# Patient Record
Sex: Female | Born: 1990 | Race: White | Hispanic: No | Marital: Married | Smoking: Never smoker
Health system: Southern US, Community
[De-identification: ages and names within clinical notes are randomized; demographics above are authoritative.]

## PROBLEM LIST (undated history)

## (undated) DIAGNOSIS — F319 Bipolar disorder, unspecified: Secondary | ICD-10-CM

## (undated) DIAGNOSIS — F3181 Bipolar II disorder: Secondary | ICD-10-CM

## (undated) DIAGNOSIS — N200 Calculus of kidney: Secondary | ICD-10-CM

## (undated) DIAGNOSIS — R51 Headache: Secondary | ICD-10-CM

## (undated) DIAGNOSIS — R519 Headache, unspecified: Secondary | ICD-10-CM

## (undated) DIAGNOSIS — F419 Anxiety disorder, unspecified: Secondary | ICD-10-CM

## (undated) HISTORY — DX: Headache: R51

## (undated) HISTORY — DX: Anxiety disorder, unspecified: F41.9

## (undated) HISTORY — PX: NO PAST SURGERIES: SHX2092

## (undated) HISTORY — DX: Calculus of kidney: N20.0

## (undated) HISTORY — DX: Headache, unspecified: R51.9

## (undated) HISTORY — DX: Bipolar disorder, unspecified: F31.9

---

## 2012-08-08 HISTORY — PX: WISDOM TOOTH EXTRACTION: SHX21

## 2017-09-05 ENCOUNTER — Emergency Department (HOSPITAL_COMMUNITY): Payer: Managed Care, Other (non HMO)

## 2017-09-05 ENCOUNTER — Encounter (HOSPITAL_COMMUNITY): Payer: Self-pay

## 2017-09-05 ENCOUNTER — Emergency Department (HOSPITAL_COMMUNITY)
Admission: EM | Admit: 2017-09-05 | Discharge: 2017-09-05 | Disposition: A | Discharge status: Home / Self Care | Payer: Managed Care, Other (non HMO) | Attending: Emergency Medicine | Admitting: Emergency Medicine

## 2017-09-05 ENCOUNTER — Other Ambulatory Visit: Payer: Self-pay

## 2017-09-05 DIAGNOSIS — R109 Unspecified abdominal pain: Secondary | ICD-10-CM

## 2017-09-05 DIAGNOSIS — Z3A01 Less than 8 weeks gestation of pregnancy: Secondary | ICD-10-CM | POA: Diagnosis not present

## 2017-09-05 DIAGNOSIS — R102 Pelvic and perineal pain: Secondary | ICD-10-CM | POA: Insufficient documentation

## 2017-09-05 DIAGNOSIS — Z79899 Other long term (current) drug therapy: Secondary | ICD-10-CM | POA: Diagnosis not present

## 2017-09-05 DIAGNOSIS — O9989 Other specified diseases and conditions complicating pregnancy, childbirth and the puerperium: Secondary | ICD-10-CM | POA: Diagnosis not present

## 2017-09-05 DIAGNOSIS — R319 Hematuria, unspecified: Secondary | ICD-10-CM | POA: Diagnosis not present

## 2017-09-05 LAB — COMPREHENSIVE METABOLIC PANEL
ALK PHOS: 38 U/L (ref 38–126)
ALT: 18 U/L (ref 14–54)
AST: 19 U/L (ref 15–41)
Albumin: 4.1 g/dL (ref 3.5–5.0)
Anion gap: 8 (ref 5–15)
BILIRUBIN TOTAL: 0.5 mg/dL (ref 0.3–1.2)
BUN: 10 mg/dL (ref 6–20)
CO2: 20 mmol/L — ABNORMAL LOW (ref 22–32)
CREATININE: 0.72 mg/dL (ref 0.44–1.00)
Calcium: 9.3 mg/dL (ref 8.9–10.3)
Chloride: 109 mmol/L (ref 101–111)
GFR calc Af Amer: >60 mL/min (ref >60)
Glucose, Bld: 115 mg/dL — ABNORMAL HIGH (ref 65–99)
Potassium: 3.7 mmol/L (ref 3.5–5.1)
Sodium: 137 mmol/L (ref 135–145)
TOTAL PROTEIN: 7.6 g/dL (ref 6.5–8.1)

## 2017-09-05 LAB — CBC WITH DIFFERENTIAL/PLATELET
BASOS ABS: 0 10*3/uL (ref 0.0–0.1)
Basophils Relative: 0 %
EOS PCT: 0 %
Eosinophils Absolute: 0 10*3/uL (ref 0.0–0.7)
HCT: 37.6 % (ref 36.0–46.0)
Hemoglobin: 13.1 g/dL (ref 12.0–15.0)
Lymphocytes Relative: 7 %
Lymphs Abs: 0.7 10*3/uL (ref 0.7–4.0)
MCH: 30.7 pg (ref 26.0–34.0)
MCHC: 34.8 g/dL (ref 30.0–36.0)
MCV: 88.1 fL (ref 78.0–100.0)
MONO ABS: 0.4 10*3/uL (ref 0.1–1.0)
Monocytes Relative: 4 %
Neutro Abs: 8.6 10*3/uL — ABNORMAL HIGH (ref 1.7–7.7)
Neutrophils Relative %: 89 %
PLATELETS: 256 10*3/uL (ref 150–400)
RBC: 4.27 MIL/uL (ref 3.87–5.11)
RDW: 12.1 % (ref 11.5–15.5)
WBC: 9.7 10*3/uL (ref 4.0–10.5)

## 2017-09-05 LAB — URINALYSIS, ROUTINE W REFLEX MICROSCOPIC
Bilirubin Urine: NEGATIVE
GLUCOSE, UA: NEGATIVE mg/dL
Ketones, ur: 80 mg/dL — AB
Leukocytes, UA: NEGATIVE
Nitrite: NEGATIVE
PH: 6 (ref 5.0–8.0)
Protein, ur: NEGATIVE mg/dL
Specific Gravity, Urine: 1.011 (ref 1.005–1.030)

## 2017-09-05 LAB — I-STAT BETA HCG BLOOD, ED (MC, WL, AP ONLY): I-stat hCG, quantitative: >2000 m[IU]/mL — ABNORMAL HIGH (ref <5)

## 2017-09-05 LAB — ABO/RH: ABO/RH(D): O NEG

## 2017-09-05 LAB — LIPASE, BLOOD: LIPASE: 31 U/L (ref 11–51)

## 2017-09-05 LAB — I-STAT CG4 LACTIC ACID, ED: LACTIC ACID, VENOUS: 1.36 mmol/L (ref 0.5–1.9)

## 2017-09-05 MED ORDER — MORPHINE SULFATE (PF) 4 MG/ML IV SOLN
4.0000 mg | Freq: Once | INTRAVENOUS | Status: AC
  Administered 2017-09-05: 4 mg via INTRAVENOUS
  Filled 2017-09-05: qty 1

## 2017-09-05 MED ORDER — ONDANSETRON 4 MG PO TBDP: 4.0000 mg | ORAL_TABLET | ORAL | 0 refills | Status: DC | PRN

## 2017-09-05 MED ORDER — ONDANSETRON HCL 4 MG/2ML IJ SOLN
4.0000 mg | Freq: Once | INTRAMUSCULAR | Status: AC
  Administered 2017-09-05: 4 mg via INTRAVENOUS
  Filled 2017-09-05: qty 2

## 2017-09-05 MED ORDER — HYDROCODONE-ACETAMINOPHEN 5-325 MG PO TABS
2.0000 | ORAL_TABLET | Freq: Once | ORAL | Status: AC
  Administered 2017-09-05: 2 via ORAL
  Filled 2017-09-05: qty 2

## 2017-09-05 MED ORDER — HYDROCODONE-ACETAMINOPHEN 5-325 MG PO TABS: 1.0000 | ORAL_TABLET | Freq: Four times a day (QID) | ORAL | 0 refills | Status: DC | PRN

## 2017-09-05 MED ORDER — SODIUM CHLORIDE 0.9 % IV BOLUS (SEPSIS)
1000.0000 mL | Freq: Once | INTRAVENOUS | Status: AC
  Administered 2017-09-05: 1000 mL via INTRAVENOUS

## 2017-09-05 MED ORDER — SODIUM CHLORIDE 0.9 % IV SOLN
1000.0000 mL | INTRAVENOUS | Status: DC
  Administered 2017-09-05: 1000 mL via INTRAVENOUS

## 2017-09-05 MED ORDER — CEPHALEXIN 500 MG PO CAPS: 1000.0000 mg | ORAL_CAPSULE | Freq: Two times a day (BID) | ORAL | 0 refills | Status: DC

## 2017-09-05 NOTE — ED Notes (Signed)
Strainer provided for patient.

## 2017-09-05 NOTE — ED Notes (Signed)
ED Provider at bedside. 

## 2017-09-05 NOTE — ED Triage Notes (Signed)
Patient c/o left flank pain, vomiting and urinary frequency since yesterday. patient is [redacted] weeks pregnant

## 2017-09-05 NOTE — ED Provider Notes (Signed)
[facility]   CSN: [id number] Arrival date & time: 09/05/17  4098     History   Chief Complaint Chief Complaint  Patient presents with  . Back Pain  . Urinary Frequency  . Emesis  . [redacted] weeks pregnant    HPI Kathleen Bass is a 27 y.o. female.  HPI Patient reports she is [redacted] weeks pregnant by dates.  She reports that yesterday late afternoon she had sudden and severe onset of left flank and pelvic pain.  She reports is been constant and she has vomited multiple times.  No fever, no chills.  She denies any pain burning or urgency of urination.  Patient was not having any vaginal discharge bleeding or spotting.  She reports she is never had similar pain.  She has not had ultrasound yet in this pregnancy.  No other medical problems except controlled bipolar disorder.  Otherwise healthy. History reviewed. No pertinent past medical history.  There are no active problems to display for this patient.   History reviewed. No pertinent surgical history.  OB History    Gravida Para Term Preterm AB Living   1             SAB TAB Ectopic Multiple Live Births                   Home Medications    Prior to Admission medications   Medication Sig Start Date End Date Taking? Authorizing Provider  LATUDA 40 MG TABS tablet Take 40 mg by mouth at bedtime. 06/07/17  Yes [provider]  cephALEXin (KEFLEX) 500 MG capsule Take 2 capsules (1,000 mg total) by mouth 2 (two) times daily. 09/05/17   Arby Barrette, MD  HYDROcodone-acetaminophen (NORCO/VICODIN) 5-325 MG tablet Take 1-2 tablets by mouth every 6 (six) hours as needed. 09/05/17   Arby Barrette, MD  ondansetron (ZOFRAN ODT) 4 MG disintegrating tablet Take 1 tablet (4 mg total) by mouth every 4 (four) hours as needed for nausea or vomiting. 09/05/17   Arby Barrette, MD    Family History Family History  Problem Relation Age of Onset  . Seizures Mother     Social  History Social History   Tobacco Use  . Smoking status: Never Smoker  . Smokeless tobacco: Never Used  Substance Use Topics  . Alcohol use: No    Frequency: Never  . Drug use: No     Allergies   Patient has no known allergies.   Review of Systems Review of Systems 10 Systems reviewed and are negative for acute change except as noted in the HPI.  Physical Exam Updated Vital Signs BP 115/82 (BP Location: Left Arm)   Pulse 97   Temp 98.2 F (36.8 C)   Resp 16   Ht 5\' 3"  (1.6 m)   Wt 63.5 kg (140 lb)   LMP 07/14/2017   SpO2 100%   BMI 24.80 kg/m   Physical Exam  Constitutional: She is oriented to person, place, and time. She appears well-developed and well-nourished. She appears distressed.  Patient is alert and appropriate.  Nontoxic.  She is however actively vomiting and appears to be in pain.  HENT:  Head: Normocephalic and atraumatic.  Mouth/Throat: Oropharynx is clear and moist.  Eyes: Conjunctivae and EOM are normal. Pupils are equal, round, and reactive to light.  Neck: Neck supple.  Cardiovascular: Normal rate, regular rhythm, normal heart sounds and intact distal pulses.  No murmur heard. Pulmonary/Chest: Effort normal and  breath sounds normal. No respiratory distress.  Abdominal: Soft. There is tenderness.  Moderate to severe pain left lower abdomen and mid abdomen.  Musculoskeletal: Normal range of motion. She exhibits no edema or tenderness.  Neurological: She is alert and oriented to person, place, and time. No cranial nerve deficit. She exhibits normal muscle tone. Coordination normal.  Skin: Skin is warm and dry.  Psychiatric: She has a normal mood and affect.  Nursing note and vitals reviewed.    ED Treatments / Results  Labs (all labs ordered are listed, but only abnormal results are displayed) Labs Reviewed  URINALYSIS, ROUTINE W REFLEX MICROSCOPIC - Abnormal; Notable for the following components:      Result Value   Hgb urine dipstick LARGE  (*)    Ketones, ur 80 (*)    Bacteria, UA RARE (*)    Squamous Epithelial / LPF 0-5 (*)    All other components within normal limits  COMPREHENSIVE METABOLIC PANEL - Abnormal; Notable for the following components:   CO2 20 (*)    Glucose, Bld 115 (*)    All other components within normal limits  CBC WITH DIFFERENTIAL/PLATELET - Abnormal; Notable for the following components:   Neutro Abs 8.6 (*)    All other components within normal limits  I-STAT BETA HCG BLOOD, ED ([facility], [facility], [facility] ONLY) - Abnormal; Notable for the following components:   I-stat hCG, quantitative >2,000.0 (*)    All other components within normal limits  URINE CULTURE  LIPASE, BLOOD  POC URINE PREG, ED  I-STAT CG4 LACTIC ACID, ED  ABO/RH    EKG  EKG Interpretation None       Radiology [place] Ob Comp Less 14 Wks  Result Date: 09/05/2017 CLINICAL DATA:  Left-sided pelvic pain for 2 days. EXAM: OBSTETRIC <14 WK US AND TRANSVAGINAL OB US TECHNIQUE: Both transabdominal and transvaginal ultrasound examinations were performed for complete evaluation of the gestation as well as the maternal uterus, adnexal regions, and pelvic cul-de-sac. Transvaginal technique was performed to assess early pregnancy. COMPARISON:  None. FINDINGS: Intrauterine gestational sac: Single Yolk sac:  Visualized. Embryo:  Visualized. Cardiac Activity: Visualized. Heart Rate: 130 bpm CRL:  11 mm   7 w   2 d                  US EDC: 04/22/2018 Subchorionic hemorrhage:  None visualized. Maternal uterus/adnexae: Normal appearance of both ovaries. No mass or abnormal free fluid identified. IMPRESSION: Single living IUP measuring 7 weeks 2 days, with US EDC of 04/22/2018. No significant maternal uterine or adnexal abnormality identified. Electronically Signed   By: Myles RosenthalJohn  Stahl M.D.   On: 09/05/2017 11:28   [place] Ob Transvaginal  Result Date: 09/05/2017 CLINICAL DATA:  Left-sided pelvic pain for 2 days. EXAM: OBSTETRIC <14 WK US AND TRANSVAGINAL OB US TECHNIQUE:  Both transabdominal and transvaginal ultrasound examinations were performed for complete evaluation of the gestation as well as the maternal uterus, adnexal regions, and pelvic cul-de-sac. Transvaginal technique was performed to assess early pregnancy. COMPARISON:  None. FINDINGS: Intrauterine gestational sac: Single Yolk sac:  Visualized. Embryo:  Visualized. Cardiac Activity: Visualized. Heart Rate: 130 bpm CRL:  11 mm   7 w   2 d                  US EDC: 04/22/2018 Subchorionic hemorrhage:  None visualized. Maternal uterus/adnexae: Normal appearance of both ovaries. No mass or abnormal free fluid identified. IMPRESSION: Single living IUP measuring 7 weeks  2 days, with [place] EDC of 04/22/2018. No significant maternal uterine or adnexal abnormality identified. Electronically Signed   By: Myles Rosenthal M.D.   On: 09/05/2017 11:28   US Renal  Result Date: 09/05/2017 CLINICAL DATA:  Initial evaluation for acute left flank pain for 2 days. EXAM: RENAL / URINARY TRACT ULTRASOUND COMPLETE COMPARISON:  None. FINDINGS: Right Kidney: Length: 10.3 cm. Echogenicity within normal limits. No mass or hydronephrosis visualized. Left Kidney: Length: 10.9 cm. Echogenicity within normal limits. No mass or hydronephrosis visualized. Bladder: Appears normal for degree of bladder distention. IMPRESSION: Normal renal ultrasound. Electronically Signed   By: Rise Mu M.D.   On: 09/05/2017 13:51    Procedures Procedures (including critical care time)  Medications Ordered in ED Medications  sodium chloride 0.9 % bolus 1,000 mL (0 mLs Intravenous Stopped 09/05/17 1039)    Followed by  0.9 %  sodium chloride infusion (0 mLs Intravenous Stopped 09/05/17 1246)  ondansetron (ZOFRAN) injection 4 mg (4 mg Intravenous Given 09/05/17 0957)  morphine 4 MG/ML injection 4 mg (4 mg Intravenous Given 09/05/17 0957)  morphine 4 MG/ML injection 4 mg (4 mg Intravenous Given 09/05/17 1246)  HYDROcodone-acetaminophen (NORCO/VICODIN) 5-325  MG per tablet 2 tablet (2 tablets Oral Given 09/05/17 1544)     Initial Impression / Assessment and Plan / ED Course  I have reviewed the triage vital signs and the nursing notes.  Pertinent labs & imaging results that were available during my care of the patient were reviewed by me and considered in my medical decision making (see chart for details).  Clinical Course as of Sep 05 1716  Tue Sep 05, 2017  1431 Consult: Dr. Adrian Black[facility].  At this time with patient pain controlled and other diagnostic evaluation negative recommends consultation with urology for management of highly suspected kidney stone.  [MP]  1556 Consult: Reviewed with Dr. Alvester Morin of urology.  Advises at this time with no hydro-patient can strain urine and take pain medications.  She is to follow-up in the office as an outpatient.  [MP]    Clinical Course User Index [MP] Arby Barrette, MD     Final Clinical Impressions(s) / ED Diagnoses   Final diagnoses:  Flank pain, acute  Less than [redacted] weeks gestation of pregnancy  Hematuria, unspecified type   Patient presents with acute and severe left lower quadrant and flank pain.  This was onset yesterday.  Clinically she is well in appearance.  Ultrasound is ruled out ectopic pregnancy.  Sister has history of kidney stone and presentation is very suggestive kidney stone in terms of pain quality and hematuria.  Ultrasound of the kidneys does not show hydronephrosis.  Have reviewed this with OB and urology on call.  At this time will treat the patient for pain and have her strain urine.  I have opted to empirically treat urine with hematuria for possibility of infection in early pregnancy.  She is clinically well in appearance and understands return precautions. ED Discharge Orders        Ordered    cephALEXin (KEFLEX) 500 MG capsule  2 times daily     09/05/17 1553    HYDROcodone-acetaminophen (NORCO/VICODIN) 5-325 MG tablet  Every 6 hours PRN     09/05/17 1553    ondansetron  (ZOFRAN ODT) 4 MG disintegrating tablet  Every 4 hours PRN     09/05/17 1553       Arby Barrette, MD 09/05/17 1719

## 2017-09-05 NOTE — Discharge Instructions (Signed)
1.  You may take Vicodin 1-2 tablets every 4-6 hours as needed for pain. 2.  Make an appointment as soon as possible to see the urologist and your OB doctor 3.  Return to the emergency department if you develop any fever, worsening pain or other concerns. 4.  Antibiotics were prescribed for urinary tract infection as sometimes urine shows positive for blood before signs of infection.  It is important that infections in pregnancy be treated early.

## 2017-09-05 NOTE — ED Notes (Signed)
Ultrasound at bedside

## 2017-09-07 LAB — URINE CULTURE: Culture: NO GROWTH

## 2017-09-22 LAB — OB RESULTS CONSOLE HIV ANTIBODY (ROUTINE TESTING): HIV: NONREACTIVE

## 2017-09-22 LAB — OB RESULTS CONSOLE ABO/RH: RH TYPE: NEGATIVE

## 2017-09-22 LAB — OB RESULTS CONSOLE RUBELLA ANTIBODY, IGM: RUBELLA: IMMUNE

## 2017-09-22 LAB — OB RESULTS CONSOLE RPR: RPR: NONREACTIVE

## 2017-09-22 LAB — OB RESULTS CONSOLE GC/CHLAMYDIA
Chlamydia: NEGATIVE
GC PROBE AMP, GENITAL: NEGATIVE

## 2017-09-22 LAB — OB RESULTS CONSOLE HEPATITIS B SURFACE ANTIGEN: Hepatitis B Surface Ag: NEGATIVE

## 2017-09-22 LAB — OB RESULTS CONSOLE ANTIBODY SCREEN: ANTIBODY SCREEN: NEGATIVE

## 2018-04-06 ENCOUNTER — Encounter (HOSPITAL_COMMUNITY): Payer: Self-pay | Admitting: *Deleted

## 2018-04-06 ENCOUNTER — Telehealth (HOSPITAL_COMMUNITY): Payer: Self-pay | Admitting: *Deleted

## 2018-04-06 NOTE — Telephone Encounter (Signed)
Preadmission screen  

## 2018-04-19 ENCOUNTER — Other Ambulatory Visit: Payer: Self-pay

## 2018-04-19 ENCOUNTER — Inpatient Hospital Stay (HOSPITAL_COMMUNITY): Payer: Managed Care, Other (non HMO) | Admitting: Anesthesiology

## 2018-04-19 ENCOUNTER — Encounter (HOSPITAL_COMMUNITY): Payer: Self-pay | Admitting: *Deleted

## 2018-04-19 ENCOUNTER — Inpatient Hospital Stay (HOSPITAL_COMMUNITY)
Admission: AD | Admit: 2018-04-19 | Discharge: 2018-04-21 | DRG: 806 | Disposition: A | Discharge status: Home / Self Care | Payer: Managed Care, Other (non HMO) | Attending: Obstetrics and Gynecology | Admitting: Obstetrics and Gynecology

## 2018-04-19 DIAGNOSIS — O99344 Other mental disorders complicating childbirth: Secondary | ICD-10-CM | POA: Diagnosis present

## 2018-04-19 DIAGNOSIS — Z23 Encounter for immunization: Secondary | ICD-10-CM

## 2018-04-19 DIAGNOSIS — Z3A40 40 weeks gestation of pregnancy: Secondary | ICD-10-CM | POA: Diagnosis not present

## 2018-04-19 DIAGNOSIS — Z3483 Encounter for supervision of other normal pregnancy, third trimester: Secondary | ICD-10-CM | POA: Diagnosis present

## 2018-04-19 DIAGNOSIS — F3181 Bipolar II disorder: Secondary | ICD-10-CM | POA: Diagnosis present

## 2018-04-19 HISTORY — DX: Bipolar II disorder: F31.81

## 2018-04-19 LAB — TYPE AND SCREEN
ABO/RH(D): O NEG
Antibody Screen: NEGATIVE

## 2018-04-19 LAB — CBC
HCT: 36.3 % (ref 36.0–46.0)
HCT: 33.5 % — ABNORMAL LOW (ref 36.0–46.0)
HEMOGLOBIN: 12.1 g/dL (ref 12.0–15.0)
HEMOGLOBIN: 11.2 g/dL — AB (ref 12.0–15.0)
MCH: 30.7 pg (ref 26.0–34.0)
MCH: 30.9 pg (ref 26.0–34.0)
MCHC: 33.3 g/dL (ref 30.0–36.0)
MCHC: 33.4 g/dL (ref 30.0–36.0)
MCV: 92.1 fL (ref 78.0–100.0)
MCV: 92.3 fL (ref 78.0–100.0)
PLATELETS: 142 10*3/uL — AB (ref 150–400)
Platelets: 141 10*3/uL — ABNORMAL LOW (ref 150–400)
RBC: 3.94 MIL/uL (ref 3.87–5.11)
RBC: 3.63 MIL/uL — AB (ref 3.87–5.11)
RDW: 14.8 % (ref 11.5–15.5)
RDW: 14.7 % (ref 11.5–15.5)
WBC: 11.3 10*3/uL — ABNORMAL HIGH (ref 4.0–10.5)
WBC: 16.7 10*3/uL — ABNORMAL HIGH (ref 4.0–10.5)

## 2018-04-19 LAB — ABO/RH: ABO/RH(D): O NEG

## 2018-04-19 MED ORDER — LIDOCAINE HCL (PF) 1 % IJ SOLN
INTRAMUSCULAR | Status: AC
  Filled 2018-04-19: qty 30

## 2018-04-19 MED ORDER — ACETAMINOPHEN 325 MG PO TABS
650.0000 mg | ORAL_TABLET | ORAL | Status: DC | PRN
  Administered 2018-04-20: 650 mg via ORAL
  Filled 2018-04-19: qty 2

## 2018-04-19 MED ORDER — WITCH HAZEL-GLYCERIN EX PADS: 1.0000 "application " | MEDICATED_PAD | CUTANEOUS | Status: DC | PRN

## 2018-04-19 MED ORDER — DIPHENHYDRAMINE HCL 25 MG PO CAPS: 25.0000 mg | ORAL_CAPSULE | Freq: Four times a day (QID) | ORAL | Status: DC | PRN

## 2018-04-19 MED ORDER — COCONUT OIL OIL
1.0000 "application " | TOPICAL_OIL | Status: DC | PRN
  Filled 2018-04-19: qty 120

## 2018-04-19 MED ORDER — MEASLES, MUMPS & RUBELLA VAC ~~LOC~~ INJ
0.5000 mL | INJECTION | Freq: Once | SUBCUTANEOUS | Status: DC
  Filled 2018-04-19: qty 0.5

## 2018-04-19 MED ORDER — PHENYLEPHRINE 40 MCG/ML (10ML) SYRINGE FOR IV PUSH (FOR BLOOD PRESSURE SUPPORT): 80.0000 ug | PREFILLED_SYRINGE | INTRAVENOUS | Status: DC | PRN

## 2018-04-19 MED ORDER — OXYCODONE-ACETAMINOPHEN 5-325 MG PO TABS: 2.0000 | ORAL_TABLET | ORAL | Status: DC | PRN

## 2018-04-19 MED ORDER — SENNOSIDES-DOCUSATE SODIUM 8.6-50 MG PO TABS
2.0000 | ORAL_TABLET | ORAL | Status: DC
  Administered 2018-04-20 – 2018-04-21 (×2): 2 via ORAL
  Filled 2018-04-19 (×2): qty 2

## 2018-04-19 MED ORDER — OXYTOCIN BOLUS FROM INFUSION: 500.0000 mL | Freq: Once | INTRAVENOUS | Status: DC

## 2018-04-19 MED ORDER — ONDANSETRON HCL 4 MG PO TABS: 4.0000 mg | ORAL_TABLET | ORAL | Status: DC | PRN

## 2018-04-19 MED ORDER — IBUPROFEN 600 MG PO TABS
600.0000 mg | ORAL_TABLET | Freq: Four times a day (QID) | ORAL | Status: DC
  Administered 2018-04-19 – 2018-04-21 (×7): 600 mg via ORAL
  Filled 2018-04-19 (×7): qty 1

## 2018-04-19 MED ORDER — DIBUCAINE 1 % RE OINT
1.0000 "application " | TOPICAL_OINTMENT | RECTAL | Status: DC | PRN
  Filled 2018-04-19: qty 28

## 2018-04-19 MED ORDER — OXYTOCIN 40 UNITS IN LACTATED RINGERS INFUSION - SIMPLE MED
INTRAVENOUS | Status: AC
  Administered 2018-04-19: 999 mL/h
  Filled 2018-04-19: qty 1000

## 2018-04-19 MED ORDER — LACTATED RINGERS IV SOLN
500.0000 mL | Freq: Once | INTRAVENOUS | Status: AC
  Administered 2018-04-19: 500 mL via INTRAVENOUS

## 2018-04-19 MED ORDER — ONDANSETRON HCL 4 MG/2ML IJ SOLN: 4.0000 mg | INTRAMUSCULAR | Status: DC | PRN

## 2018-04-19 MED ORDER — EPHEDRINE 5 MG/ML INJ: 10.0000 mg | INTRAVENOUS | Status: DC | PRN

## 2018-04-19 MED ORDER — DIPHENHYDRAMINE HCL 50 MG/ML IJ SOLN: 12.5000 mg | INTRAMUSCULAR | Status: DC | PRN

## 2018-04-19 MED ORDER — PRENATAL MULTIVITAMIN CH
1.0000 | ORAL_TABLET | Freq: Every day | ORAL | Status: DC
  Filled 2018-04-19: qty 1

## 2018-04-19 MED ORDER — SOD CITRATE-CITRIC ACID 500-334 MG/5ML PO SOLN: 30.0000 mL | ORAL | Status: DC | PRN

## 2018-04-19 MED ORDER — TETANUS-DIPHTH-ACELL PERTUSSIS 5-2.5-18.5 LF-MCG/0.5 IM SUSP
0.5000 mL | Freq: Once | INTRAMUSCULAR | Status: DC
  Filled 2018-04-19: qty 0.5

## 2018-04-19 MED ORDER — LIDOCAINE HCL (PF) 1 % IJ SOLN
INTRAMUSCULAR | Status: DC | PRN
  Administered 2018-04-19 (×2): 6 mL via EPIDURAL

## 2018-04-19 MED ORDER — PHENYLEPHRINE 40 MCG/ML (10ML) SYRINGE FOR IV PUSH (FOR BLOOD PRESSURE SUPPORT)
80.0000 ug | PREFILLED_SYRINGE | INTRAVENOUS | Status: DC | PRN
  Filled 2018-04-19: qty 10

## 2018-04-19 MED ORDER — OXYCODONE-ACETAMINOPHEN 5-325 MG PO TABS: 1.0000 | ORAL_TABLET | ORAL | Status: DC | PRN

## 2018-04-19 MED ORDER — MEDROXYPROGESTERONE ACETATE 150 MG/ML IM SUSP: 150.0000 mg | INTRAMUSCULAR | Status: DC | PRN

## 2018-04-19 MED ORDER — LIDOCAINE HCL (PF) 1 % IJ SOLN
30.0000 mL | INTRAMUSCULAR | Status: DC | PRN
  Filled 2018-04-19: qty 30

## 2018-04-19 MED ORDER — FENTANYL 2.5 MCG/ML BUPIVACAINE 1/10 % EPIDURAL INFUSION (WH - ANES)
14.0000 mL/h | INTRAMUSCULAR | Status: DC | PRN
  Administered 2018-04-19: 14 mL/h via EPIDURAL
  Filled 2018-04-19: qty 100

## 2018-04-19 MED ORDER — BUTORPHANOL TARTRATE 1 MG/ML IJ SOLN
1.0000 mg | Freq: Once | INTRAMUSCULAR | Status: AC
  Administered 2018-04-19: 1 mg via INTRAVENOUS
  Filled 2018-04-19: qty 1

## 2018-04-19 MED ORDER — OXYTOCIN 40 UNITS IN LACTATED RINGERS INFUSION - SIMPLE MED: 2.5000 [IU]/h | INTRAVENOUS | Status: DC

## 2018-04-19 MED ORDER — BENZOCAINE-MENTHOL 20-0.5 % EX AERO
1.0000 "application " | INHALATION_SPRAY | CUTANEOUS | Status: DC | PRN
  Filled 2018-04-19 (×2): qty 56

## 2018-04-19 MED ORDER — LACTATED RINGERS IV SOLN: 500.0000 mL | INTRAVENOUS | Status: DC | PRN

## 2018-04-19 MED ORDER — ONDANSETRON HCL 4 MG/2ML IJ SOLN: 4.0000 mg | Freq: Four times a day (QID) | INTRAMUSCULAR | Status: DC | PRN

## 2018-04-19 MED ORDER — LACTATED RINGERS IV SOLN
INTRAVENOUS | Status: DC
  Administered 2018-04-19 (×2): via INTRAVENOUS

## 2018-04-19 MED ORDER — SIMETHICONE 80 MG PO CHEW: 80.0000 mg | CHEWABLE_TABLET | ORAL | Status: DC | PRN

## 2018-04-19 MED ORDER — ACETAMINOPHEN 325 MG PO TABS: 650.0000 mg | ORAL_TABLET | ORAL | Status: DC | PRN

## 2018-04-19 NOTE — Progress Notes (Signed)
NICU at bedside for vacuum assisted delivery

## 2018-04-19 NOTE — Anesthesia Procedure Notes (Signed)
Epidural Patient location during procedure: OB Start time: 04/19/2018 1:00 PM End time: 04/19/2018 1:04 PM  Staffing Anesthesiologist: Leilani AbleHatchett, Abiageal Blowe, MD Performed: anesthesiologist   Preanesthetic Checklist Completed: patient identified, site marked, surgical consent, pre-op evaluation, timeout performed, IV checked, risks and benefits discussed and monitors and equipment checked  Epidural Patient position: sitting Prep: site prepped and draped and DuraPrep Patient monitoring: continuous pulse ox and blood pressure Approach: midline Location: L3-L4 Injection technique: LOR air  Needle:  Needle type: Tuohy  Needle gauge: 17 G Needle length: 9 cm and 9 Needle insertion depth: 5 cm cm Catheter type: closed end flexible Catheter size: 19 Gauge Catheter at skin depth: 10 cm Test dose: negative and Other  Assessment Sensory level: T9 Events: blood not aspirated, injection not painful, no injection resistance, negative IV test and no paresthesia  Additional Notes Reason for block:procedure for pain

## 2018-04-19 NOTE — MAU Note (Signed)
Started contracting at 0300. Contractions getting closer and stronger.  Was 1 cm last wk.  Scheduled for midnight induction tonight

## 2018-04-19 NOTE — Progress Notes (Addendum)
Called to room after 2h of pushing Patient is exhausted.  Discussed vacuum assist and pt agrees R/b/a reviewed, informed consent, foley removed. Bell vacuum applied and gentle traction applied with next 2 pushes Infant brought to crowning and vacuum released Midline episiotomy cut and infant delivered with next 2 pushes vigorous female infant placed on moms chest w/ apgars of 9,9.  Placenta delivered spontaneous w/ 3VC.   2nd degree midline episiotomy repaired w/ 3-0 vicryl rapide.  Fundus firm.  EBL 150cc .

## 2018-04-19 NOTE — H&P (Signed)
Wynona CanesMichelle Pio is a 27 y.o. female G1 @ 40 wks presenting for SOL/SROM - light meconium.  OB History    Gravida  1   Para      Term      Preterm      AB      Living        SAB      TAB      Ectopic      Multiple      Live Births             Past Medical History:  Diagnosis Date  . Anxiety   . Bipolar 2 disorder (HCC)    on meds, good control- doing well  . Bipolar disorder (HCC)   . Headache   . Kidney stone    Past Surgical History:  Procedure Laterality Date  . NO PAST SURGERIES     Family History: family history includes Seizures in her mother. Social History:  reports that she has never smoked. She has never used smokeless tobacco. She reports that she does not drink alcohol or use drugs.     Maternal Diabetes: No Genetic Screening: Declined Maternal Ultrasounds/Referrals: Normal Fetal Ultrasounds or other Referrals:  None Maternal Substance Abuse:  No Significant Maternal Medications:  Meds include: Other: latud Significant Maternal Lab Results:  None Other Comments:  None  ROS History Dilation: 1 Effacement (%): 80 Station: -1 Exam by:: jolynn Blood pressure 129/83, pulse (!) 106, temperature 98.5 F (36.9 C), temperature source Oral, resp. rate 18, last menstrual period 07/14/2017. Exam Physical Exam  Prenatal labs: ABO, Rh: O/Negative/-- (02/15 0000) Antibody: Negative (02/15 0000) Rubella: Immune (02/15 0000) RPR: Nonreactive (02/15 0000)  HBsAg: Negative (02/15 0000)  HIV: Non-reactive (02/15 0000)  GBS:   negative  Assessment/Plan: Admit Exp mngt Epidural prn   Zelphia CairoGretchen Kaylen Nghiem 04/19/2018, 9:55 AM

## 2018-04-19 NOTE — Progress Notes (Signed)
Pt comfortable with epidural  FHT cat 1 Toco Q2 Cvx 9/C/0 AROM forebag  A/P:  Continue exp mngt

## 2018-04-19 NOTE — Anesthesia Pain Management Evaluation Note (Signed)
  CRNA Pain Management Visit Note  Patient: Kathleen Bass, 27 y.o., female  "Hello I am a member of the anesthesia team at [facility]. We have an anesthesia team available at all times to provide care throughout the hospital, including epidural management and anesthesia for C-section. I don't know your plan for the delivery whether it a natural birth, water birth, IV sedation, nitrous supplementation, doula or epidural, but we want to meet your pain goals."   1.Was your pain managed to your expectations on prior hospitalizations?   No prior hospitalizations  2.What is your expectation for pain management during this hospitalization?     Epidural  3.How can we help you reach that goal? *support**  Record the patient's initial score and the patient's pain goal.   Pain: 6  Pain Goal: 8 [facility] wants you to be able to say your pain was always managed very well.  Trellis PaganiniBREWER,Avacyn Kloosterman N 04/19/2018

## 2018-04-19 NOTE — Anesthesia Preprocedure Evaluation (Signed)
Anesthesia Evaluation  Patient identified by MRN, date of birth, ID band Patient awake    Reviewed: Allergy & Precautions, H&P , NPO status , Patient's Chart, lab work & pertinent test results  Airway Mallampati: II  TM Distance: >3 FB Neck ROM: full    Dental no notable dental hx. (+) Teeth Intact   Pulmonary neg pulmonary ROS,    Pulmonary exam normal breath sounds clear to auscultation       Cardiovascular negative cardio ROS Normal cardiovascular exam Rhythm:regular Rate:Normal     Neuro/Psych PSYCHIATRIC DISORDERS Bipolar Disorder    GI/Hepatic negative GI ROS, Neg liver ROS,   Endo/Other  negative endocrine ROS  Renal/GU      Musculoskeletal negative musculoskeletal ROS (+)   Abdominal (+) + obese,   Peds  Hematology negative hematology ROS (+)   Anesthesia Other Findings   Reproductive/Obstetrics (+) Pregnancy                             Anesthesia Physical Anesthesia Plan  ASA: II  Anesthesia Plan: Epidural   Post-op Pain Management:    Induction:   PONV Risk Score and Plan:   Airway Management Planned:   Additional Equipment:   Intra-op Plan:   Post-operative Plan:   Informed Consent: I have reviewed the patients History and Physical, chart, labs and discussed the procedure including the risks, benefits and alternatives for the proposed anesthesia with the patient or authorized representative who has indicated his/her understanding and acceptance.     Plan Discussed with:   Anesthesia Plan Comments:         Anesthesia Quick Evaluation

## 2018-04-20 ENCOUNTER — Inpatient Hospital Stay (HOSPITAL_COMMUNITY): Admission: RE | Admit: 2018-04-20 | Payer: Managed Care, Other (non HMO) | Source: Ambulatory Visit

## 2018-04-20 ENCOUNTER — Encounter (HOSPITAL_COMMUNITY): Payer: Self-pay | Admitting: *Deleted

## 2018-04-20 LAB — CBC
HEMATOCRIT: 29.5 % — AB (ref 36.0–46.0)
Hemoglobin: 9.8 g/dL — ABNORMAL LOW (ref 12.0–15.0)
MCH: 30.7 pg (ref 26.0–34.0)
MCHC: 33.2 g/dL (ref 30.0–36.0)
MCV: 92.5 fL (ref 78.0–100.0)
Platelets: 120 10*3/uL — ABNORMAL LOW (ref 150–400)
RBC: 3.19 MIL/uL — AB (ref 3.87–5.11)
RDW: 14.9 % (ref 11.5–15.5)
WBC: 10.4 10*3/uL (ref 4.0–10.5)

## 2018-04-20 LAB — RPR: RPR Ser Ql: NONREACTIVE

## 2018-04-20 MED ORDER — INFLUENZA VAC SPLIT QUAD 0.5 ML IM SUSY
0.5000 mL | PREFILLED_SYRINGE | INTRAMUSCULAR | Status: AC
  Administered 2018-04-21: 0.5 mL via INTRAMUSCULAR
  Filled 2018-04-20: qty 0.5

## 2018-04-20 MED ORDER — COMPLETENATE 29-1 MG PO CHEW
1.0000 | CHEWABLE_TABLET | Freq: Every day | ORAL | Status: DC
  Administered 2018-04-20: 1 via ORAL
  Filled 2018-04-20 (×2): qty 1

## 2018-04-20 MED ORDER — RHO D IMMUNE GLOBULIN 1500 UNIT/2ML IJ SOSY
300.0000 ug | PREFILLED_SYRINGE | Freq: Once | INTRAMUSCULAR | Status: AC
  Administered 2018-04-20: 300 ug via INTRAVENOUS
  Filled 2018-04-20: qty 2

## 2018-04-20 NOTE — Progress Notes (Signed)
Patient doing well  No complaints. BP 127/79   Pulse 99   Temp 98.3 F (36.8 C)   Resp 18   Ht 5\' 2"  (1.575 m)   Wt 83.9 kg   LMP 07/14/2017   SpO2 98%   BMI 33.84 kg/m  Results for orders placed or performed during the hospital encounter of 04/19/18 (from the past 24 hour(s))  CBC     Status: Abnormal   Collection Time: 04/19/18 10:43 AM  Result Value Ref Range   WBC 11.3 (H) 4.0 - 10.5 K/uL   RBC 3.94 3.87 - 5.11 MIL/uL   Hemoglobin 12.1 12.0 - 15.0 g/dL   HCT 47.836.3 29.536.0 - 62.146.0 %   MCV 92.1 78.0 - 100.0 fL   MCH 30.7 26.0 - 34.0 pg   MCHC 33.3 30.0 - 36.0 g/dL   RDW 30.814.8 65.711.5 - 84.615.5 %   Platelets 141 (L) 150 - 400 K/uL  Type and screen [facility]     Status: None   Collection Time: 04/19/18 10:43 AM  Result Value Ref Range   ABO/RH(D) O NEG    Antibody Screen NEG    Sample Expiration      04/22/2018 Performed at [facility], [address] [place], [place] [address]   RPR     Status: None   Collection Time: 04/19/18 10:43 AM  Result Value Ref Range   RPR Ser Ql Non Reactive Non Reactive  ABO/Rh     Status: None   Collection Time: 04/19/18 10:43 AM  Result Value Ref Range   ABO/RH(D)      O NEG Performed at [facility], [address] [place], [place] [address]   CBC     Status: Abnormal   Collection Time: 04/19/18  7:47 PM  Result Value Ref Range   WBC 16.7 (H) 4.0 - 10.5 K/uL   RBC 3.63 (L) 3.87 - 5.11 MIL/uL   Hemoglobin 11.2 (L) 12.0 - 15.0 g/dL   HCT 24.433.5 (L) 01.036.0 - 27.246.0 %   MCV 92.3 78.0 - 100.0 fL   MCH 30.9 26.0 - 34.0 pg   MCHC 33.4 30.0 - 36.0 g/dL   RDW 53.614.7 64.411.5 - 03.415.5 %   Platelets 142 (L) 150 - 400 K/uL  CBC     Status: Abnormal   Collection Time: 04/20/18  5:50 AM  Result Value Ref Range   WBC 10.4 4.0 - 10.5 K/uL   RBC 3.19 (L) 3.87 - 5.11 MIL/uL   Hemoglobin 9.8 (L) 12.0 - 15.0 g/dL   HCT 74.229.5 (L) 59.536.0 - 63.846.0 %   MCV 92.5 78.0 - 100.0 fL   MCH 30.7 26.0 - 34.0 pg   MCHC 33.2 30.0 - 36.0 g/dL   RDW  75.614.9 43.311.5 - 29.515.5 %   Platelets 120 (L) 150 - 400 K/uL   Abdomen soft and non tender  PPD # 1  Doing well Circ later today Routine care

## 2018-04-20 NOTE — Anesthesia Postprocedure Evaluation (Signed)
Anesthesia Post Note  Patient: Kathleen Bass  Procedure(s) Performed: AN AD HOC LABOR EPIDURAL     Patient location during evaluation: Mother Baby Anesthesia Type: Epidural Level of consciousness: awake and alert Pain management: pain level controlled Vital Signs Assessment: post-procedure vital signs reviewed and stable Respiratory status: spontaneous breathing, nonlabored ventilation and respiratory function stable Cardiovascular status: stable Postop Assessment: no headache, no backache, epidural receding and patient able to bend at knees Anesthetic complications: no    Last Vitals:  Vitals:   04/20/18 0005 04/20/18 0007  BP: 128/62 127/79  Pulse: 99   Resp: 18 18  Temp:    SpO2: 98% 98%    Last Pain:  Vitals:   04/20/18 0010  TempSrc:   PainSc: 0-No pain   Pain Goal:                 Rica RecordsICKELTON,[name]

## 2018-04-20 NOTE — Lactation Note (Signed)
This note was copied from a baby's chart. Lactation Consultation Note  Patient Name: Kathleen Wynona CanesMichelle Marston [id number]'QToday's Date: 04/20/2018 Reason for consult: Initial assessment;Term;1st time breastfeeding P1, 6 hour female infant , term. Per mom, did not attend any BF classes in her pregnancy. Per mom, has medela DEBP at home. LC entered room dad was doing STS with infant. Per parents, Infant had one wet diaper since delivery. LC ask mom sit upright, LC assisted w/ latching infant on right breast using cross-cradle position, infant slipped on nipple tip, ask mom break latch w/ her finger, infant re-latched ask mom bring infant closer his nose to  her breastbreast, infant latched with wide gape, chin down, LC observed swallows.Latch-9. LC observed feeding of 10 minutes and mom was still BF infant as LC left the room. Mom has small door knob  shaped nipples but infant latches well w/out problems. Mom is feeling confident with her  BF abilities. LC discussed I&O. Mom encouraged to feed baby 8-12 times/24 hours and with feeding cues.  Reviewed Baby & Me book's Breastfeeding Basics.  Mom made aware of O/P services, breastfeeding support groups, community resources, and our phone # for post-discharge questions.  Maternal Data Formula Feeding for Exclusion: No Has patient been taught Hand Expression?: Yes([facility] demostrated hand expression to [facility] Does the patient have breastfeeding experience prior to this delivery?: No  Feeding Feeding Type: Breast Fed Length of feed: 10 min(Mom was still BF as LC left room.)  LATCH Score Latch: Grasps breast easily, tongue down, lips flanged, rhythmical sucking.  Audible Swallowing: Spontaneous and intermittent  Type of Nipple: Everted at rest and after stimulation  Comfort (Breast/Nipple): Soft / non-tender  Hold (Positioning): Assistance needed to correctly position infant at breast and maintain latch.  LATCH Score: 9  Interventions Interventions: Breast  feeding basics reviewed;Support pillows;Assisted with latch;Position options;Skin to skin;Hand express;Breast compression;Adjust position  Lactation Tools Discussed/Used WIC Program: No   Consult Status Consult Status: Follow-up Date: 04/20/18 Follow-up type: In-patient    Danelle EarthlyRobin Annleigh Knueppel 04/20/2018, 12:35 AM

## 2018-04-20 NOTE — Progress Notes (Signed)
CSW consult acknowledged for history of Bipolar Disorder.  CSW spoke with patient and husband in patient's room to assess and assist as needed. Patient and husband were open, receptive to visit.  Patient reports she has long history of mood and anxiety issues. Patient states she feels she was "finally accurately diagnosed about 4 years ago with Bipolar II." Patient now followed for medication by Dr. Hughes at [facility]. Patient states Dr. KizKizzie Banezie BaneHughes made medication adjustments during pregnancy and followed her closely. Patient already has appointment scheduled with Dr. Kizzie BaneHughes for first week of October.  CSW discussed signs and symptoms of post partum depression with both patient and husband. Patient admits she has been worried and has had many conversations with her psychiatrist.  Patient has strong network of family support.  Husband has 4 weeks of paid leave for post partum period.  Patient states her mother is already scheduled to come from [place] for the week when husband returns to work. Patient states she dies not plan to return to work.  Patient with strong treatment plan to include close follow up and good family support.  No needs expressed. No barriers to discharge.   Gerrie NordmannMichelle Barrett-Hilton, LCSW [phone]

## 2018-04-21 LAB — RH IG WORKUP (INCLUDES ABO/RH)
ABO/RH(D): O NEG
FETAL SCREEN: NEGATIVE
Gestational Age(Wks): 39.6
UNIT DIVISION: 0

## 2018-04-21 NOTE — Discharge Summary (Signed)
Obstetric Discharge Summary Reason for Admission: rupture of membranes Prenatal Procedures: none Intrapartum Procedures: vacuum Postpartum Procedures: none Complications-Operative and Postpartum: 2nd degree perineal laceration Hemoglobin  Date Value Ref Range Status  04/20/2018 9.8 (Bass) 12.0 - 15.0 g/dL Final   HCT  Date Value Ref Range Status  04/20/2018 29.5 (Bass) 36.0 - 46.0 % Final    Physical Exam:  General: alert, cooperative and appears stated age 13Lochia: appropriate Uterine Fundus: firm Incision: healing well, no significant drainage, no dehiscence DVT Evaluation: No evidence of DVT seen on physical exam.  Discharge Diagnoses: Term Pregnancy-delivered  Discharge Information: Date: 04/21/2018 Activity: pelvic rest Diet: routine Medications: None Condition: improved Instructions: refer to practice specific booklet Discharge to: home   Newborn Data: Live born female  Birth Weight: 7 lb 8.5 oz (3416 g) APGAR: 9, 9  Newborn Delivery   Birth date/time:  04/19/2018 18:06:00 Delivery type:  Vaginal, Vacuum (Extractor)     Home with mother.  Kathleen Bass,Kathleen Bass 04/21/2018, 7:13 AM

## 2019-04-30 LAB — OB RESULTS CONSOLE RPR: RPR: NONREACTIVE

## 2019-04-30 LAB — OB RESULTS CONSOLE RUBELLA ANTIBODY, IGM: Rubella: IMMUNE

## 2019-04-30 LAB — OB RESULTS CONSOLE HEPATITIS B SURFACE ANTIGEN: Hepatitis B Surface Ag: NEGATIVE

## 2019-04-30 LAB — OB RESULTS CONSOLE HIV ANTIBODY (ROUTINE TESTING): HIV: NONREACTIVE

## 2019-05-20 IMAGING — US US OB COMP LESS 14 WK
1 series · 14 of 28 positions shown · non-contrast
Comparison: None.

CLINICAL DATA: Left-sided pelvic pain for 2 days.

EXAM:
OBSTETRIC <14 WK US AND TRANSVAGINAL OB US
TECHNIQUE: Both transabdominal and transvaginal ultrasound examinations were
performed for complete evaluation of the gestation as well as the
maternal uterus, adnexal regions, and pelvic cul-de-sac.
Transvaginal technique was performed to assess early pregnancy.

[Series 1: us ob comp less 14 wk · 0.23mm/px · 14 of 83 slices shown]
[im 4/83]
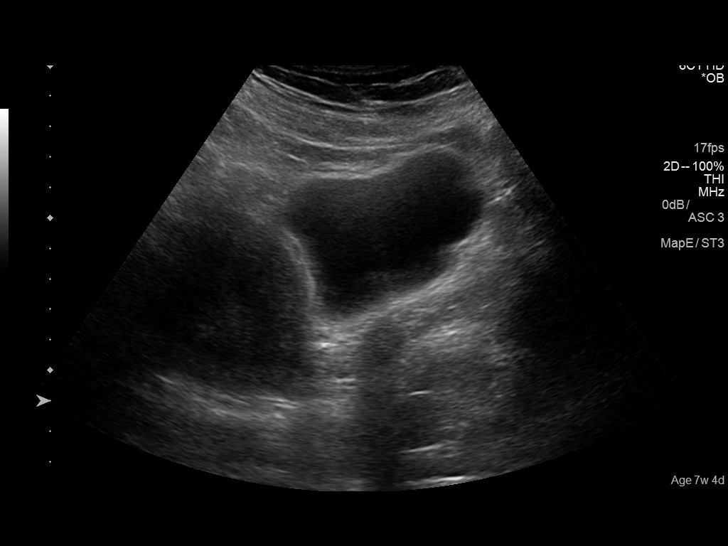
[im 10/83]
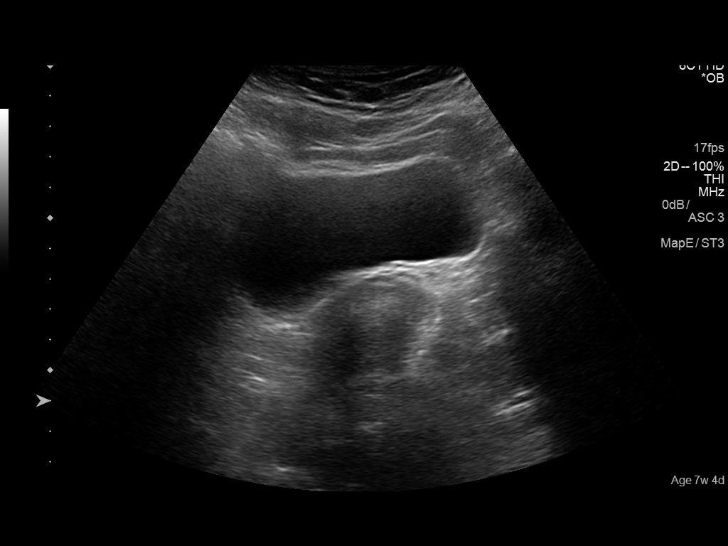
[im 16/83]
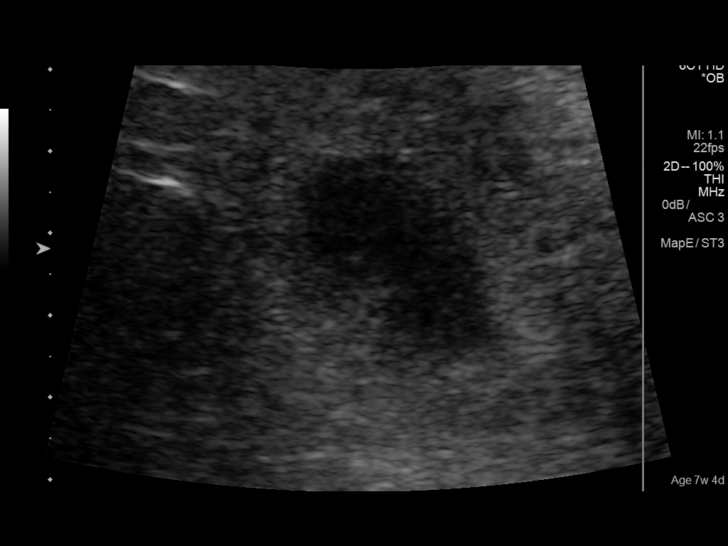
[im 22/83]
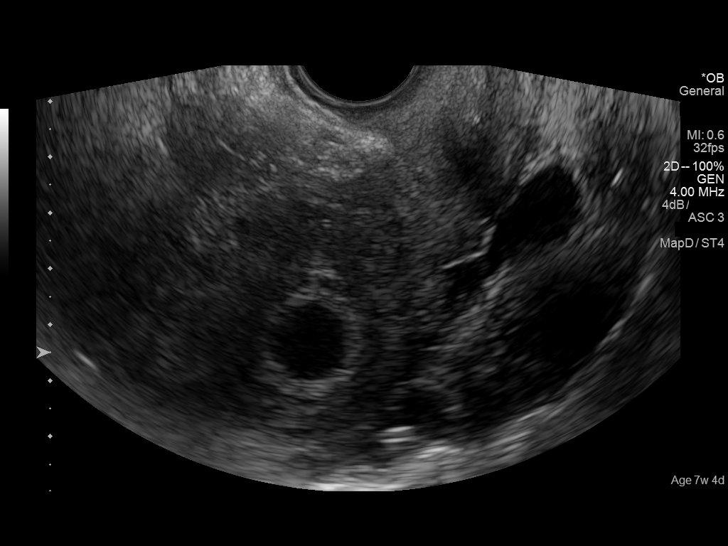
[im 28/83]
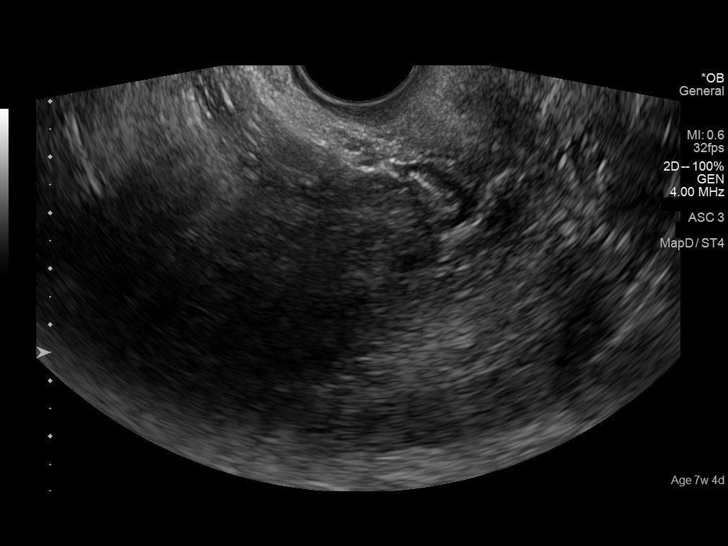
[im 34/83]
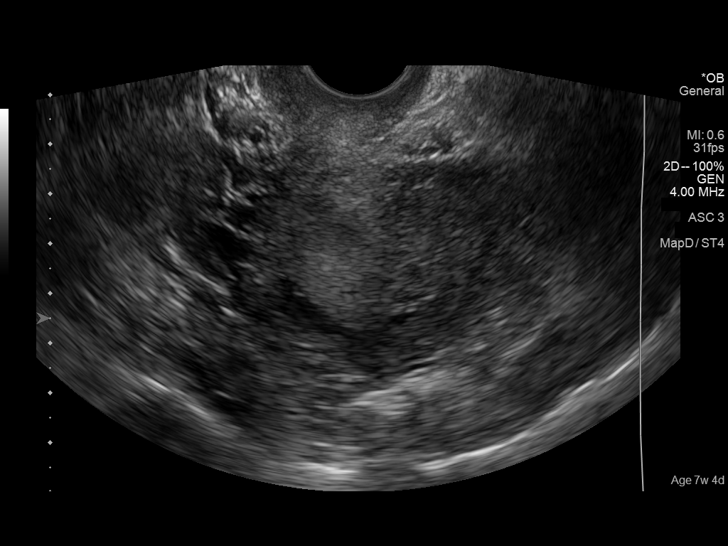
[im 40/83]
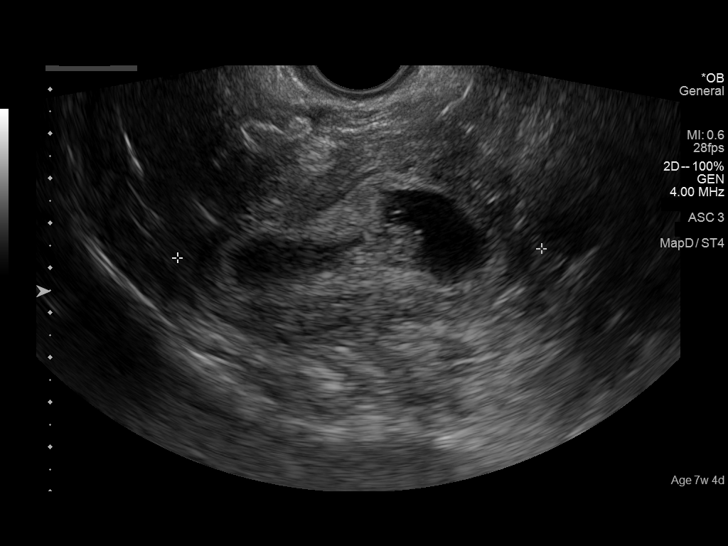
[im 46/83]
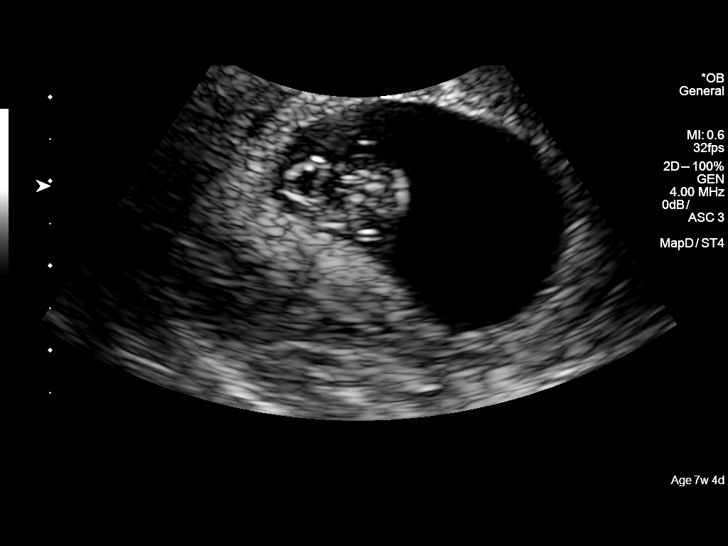
[im 52/83]
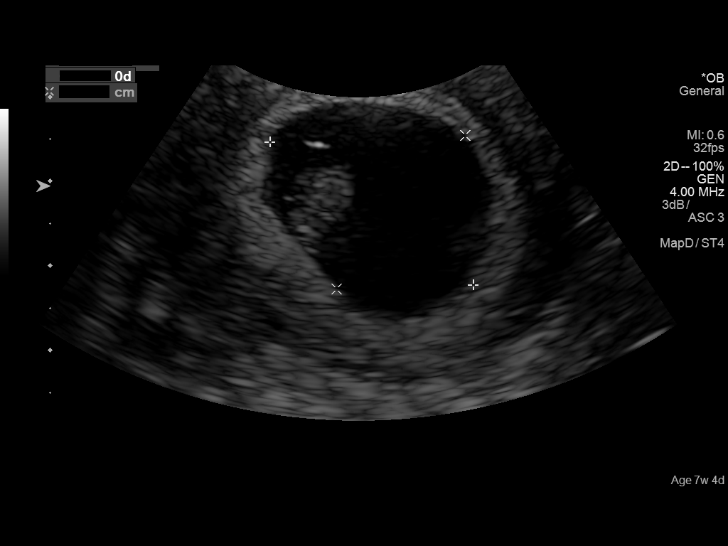
[im 58/83]
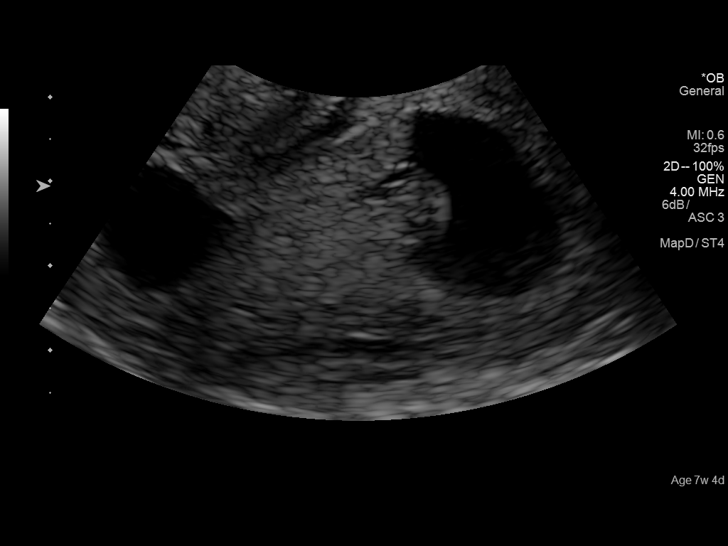
[im 64/83]
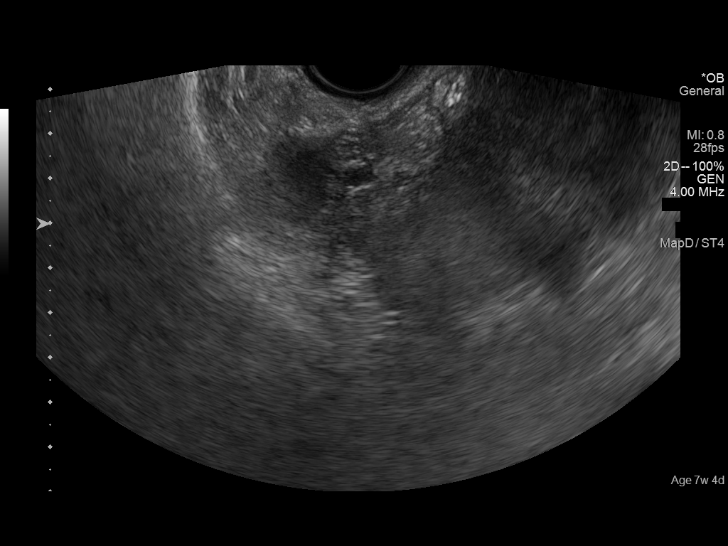
[im 70/83]
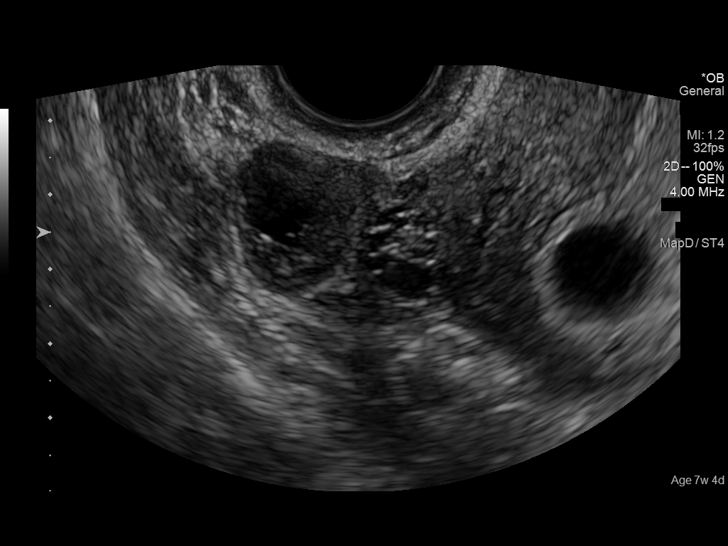
[im 76/83]
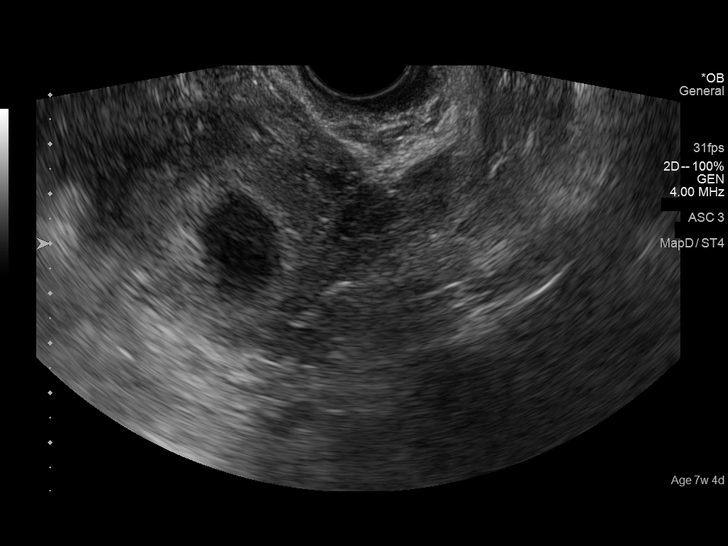
[im 83/83]
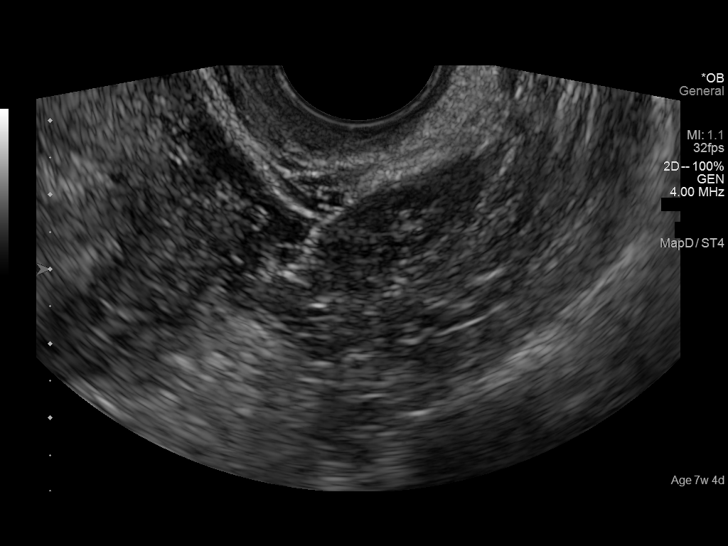

[14 of 28 positions shown; findings below may reference images not displayed]

FINDINGS: Intrauterine gestational sac: Single

Yolk sac:  Visualized.

Embryo:  Visualized.

Cardiac Activity: Visualized.

Heart Rate: 130 bpm

CRL:  11 mm   7 w   2 d                  US EDC: 04/22/2018

Subchorionic hemorrhage:  None visualized.

Maternal uterus/adnexae: Normal appearance of both ovaries. No mass
or abnormal free fluid identified.
IMPRESSION: Single living IUP measuring 7 weeks 2 days, with US EDC of
04/22/2018.

No significant maternal uterine or adnexal abnormality identified.

## 2019-05-20 IMAGING — US US RENAL
1 series · 14 of 25 positions shown · non-contrast
Comparison: None.

CLINICAL DATA: Initial evaluation for acute left flank pain for 2
days.

EXAM:
RENAL / URINARY TRACT ULTRASOUND COMPLETE

[Series 1: us renal · 0.25mm/px · 14 of 41 slices shown]
[im 1/41]
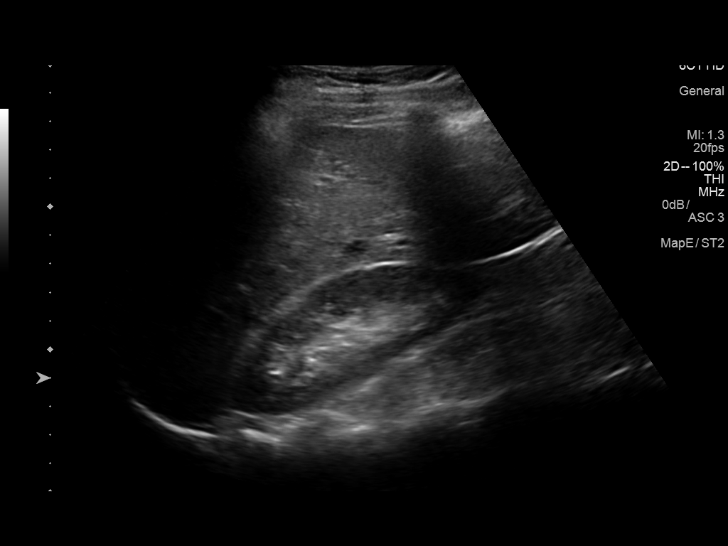
[im 4/41]
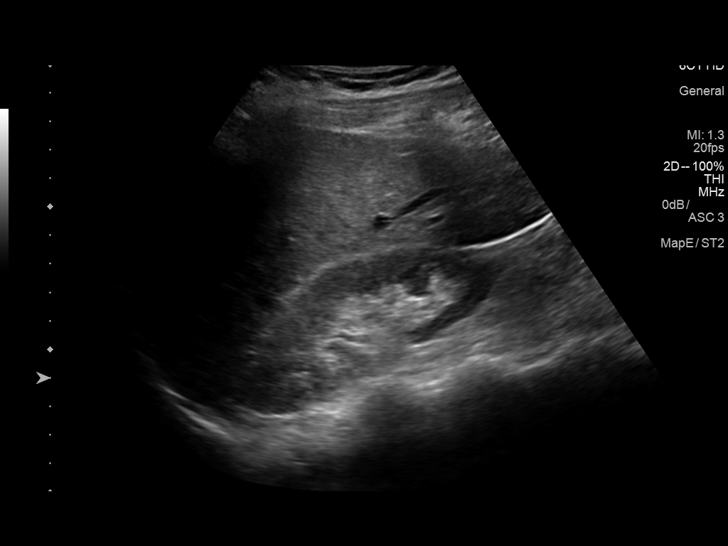
[im 7/41]
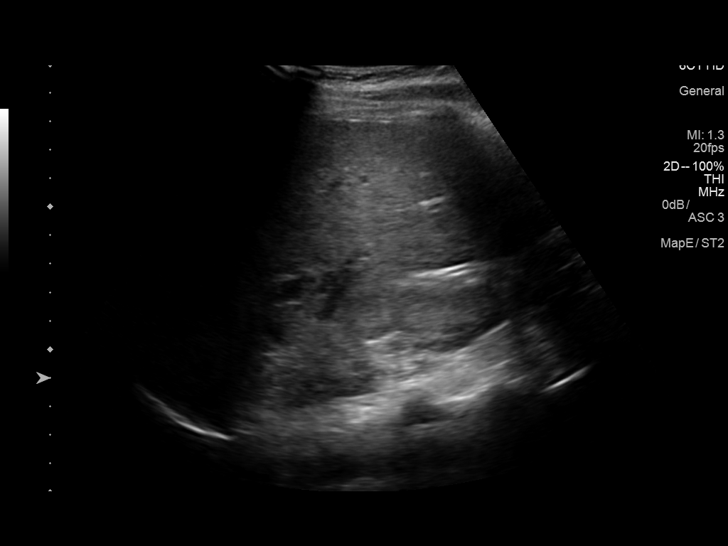
[im 11/41]
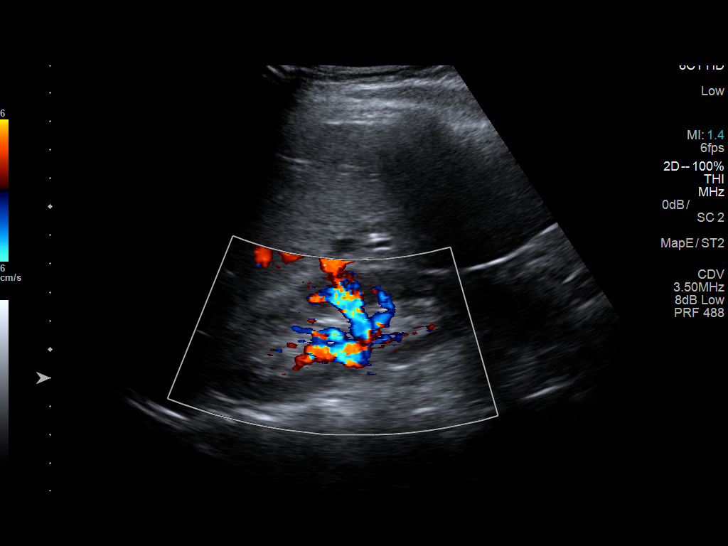
[im 14/41]
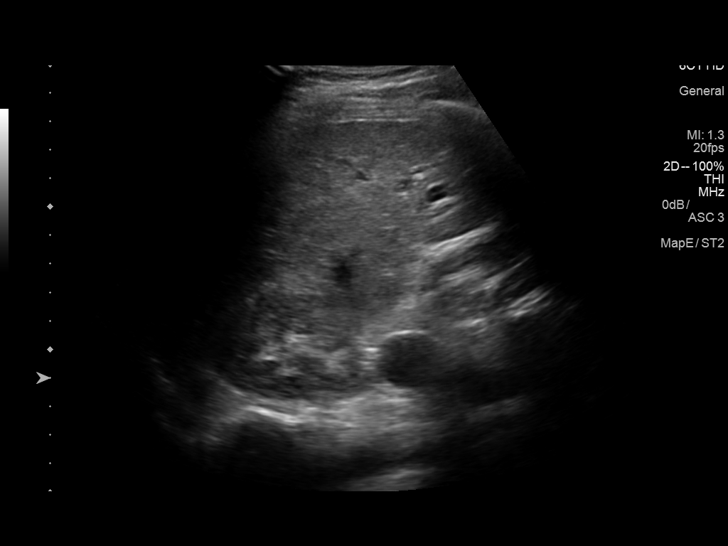
[im 16/41]
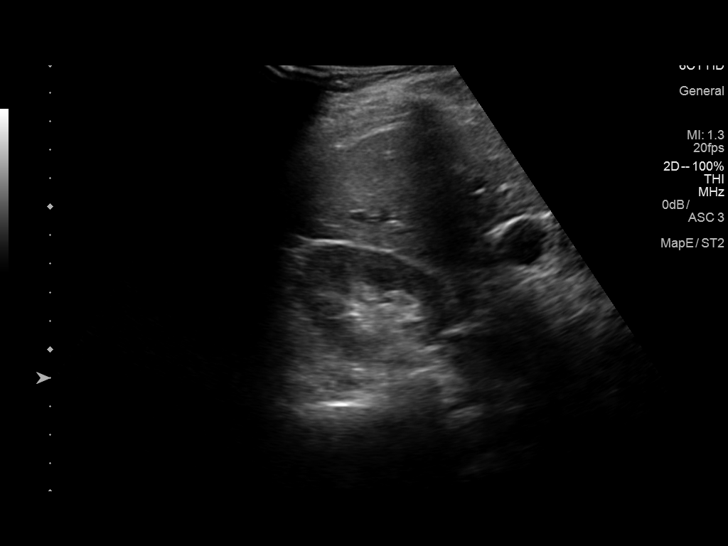
[im 19/41]
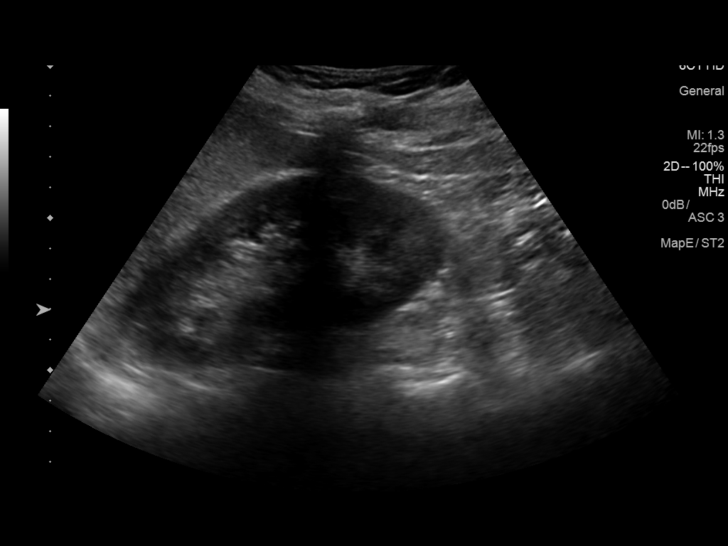
[im 22/41]
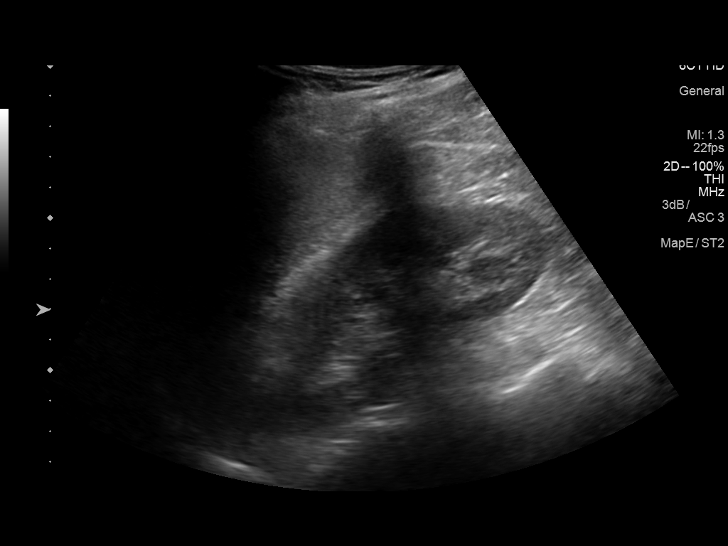
[im 26/41]
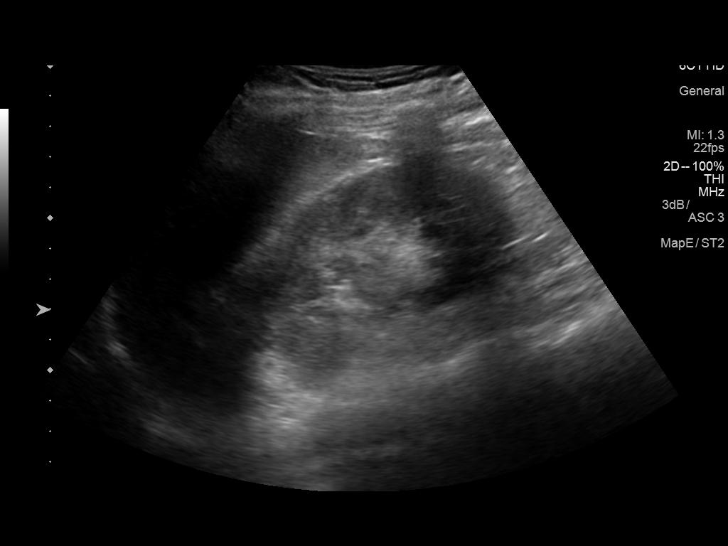
[im 27/41]
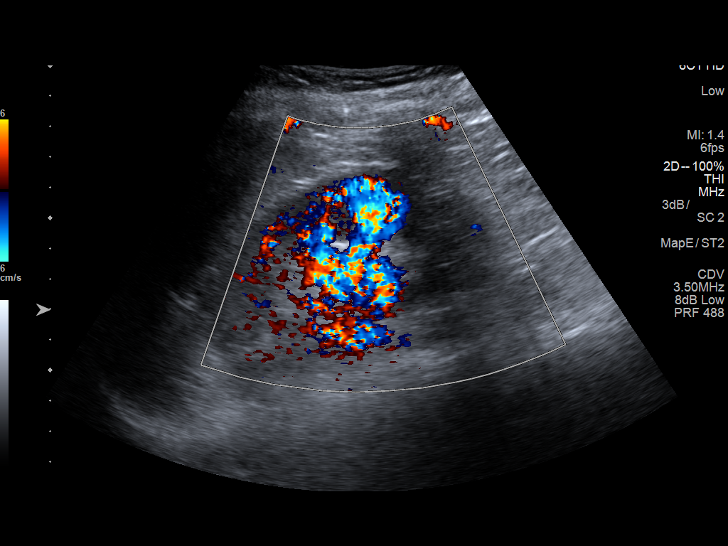
[im 31/41]
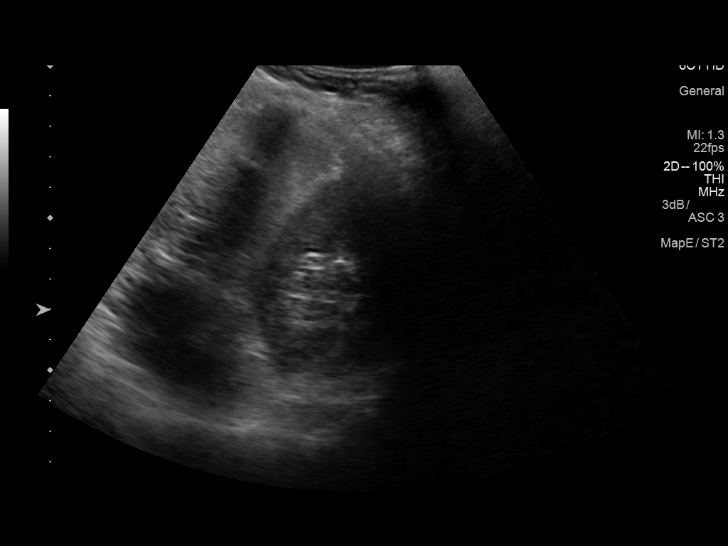
[im 34/41]
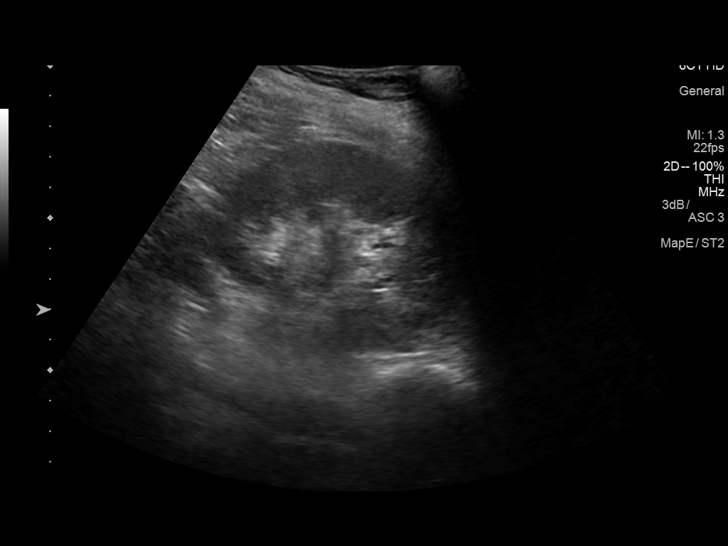
[im 37/41]
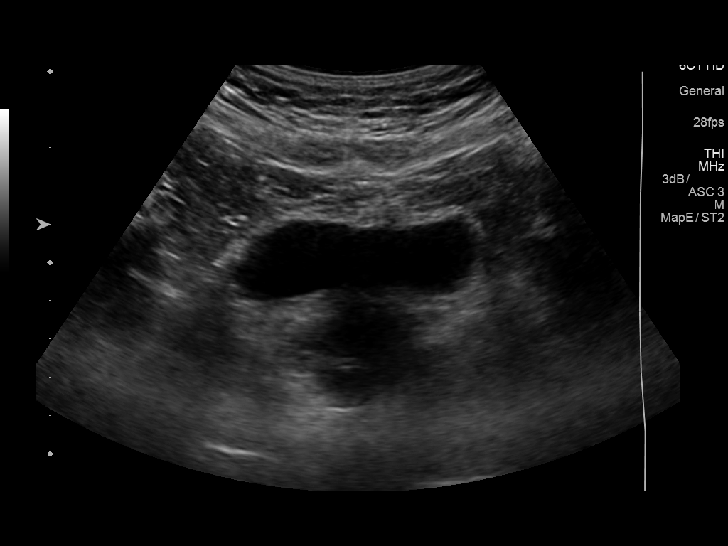
[im 41/41]
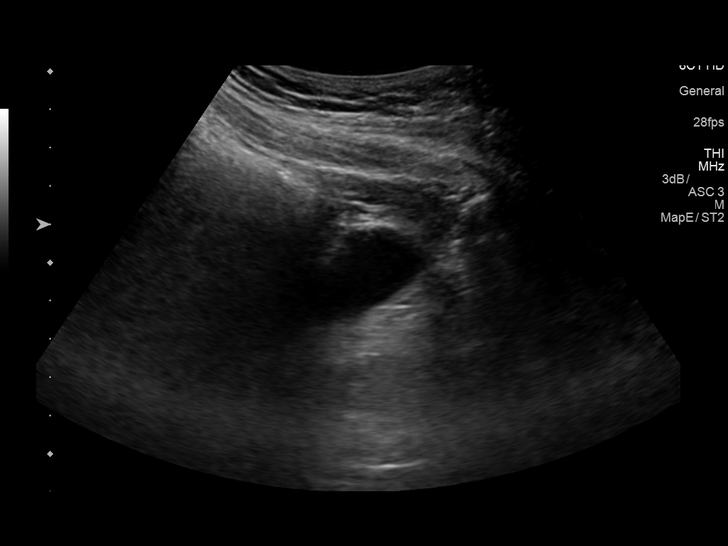

[14 of 25 positions shown; findings below may reference images not displayed]

FINDINGS: Right Kidney:

Length: 10.3 cm. Echogenicity within normal limits. No mass or
hydronephrosis visualized.

Left Kidney:

Length: 10.9 cm. Echogenicity within normal limits. No mass or
hydronephrosis visualized.

Bladder:

Appears normal for degree of bladder distention.
IMPRESSION: Normal renal ultrasound.

## 2019-08-09 NOTE — L&D Delivery Note (Signed)
Operative Delivery Note At 7:06 PM a viable female was delivered via Vaginal, Vacuum (Extractor) over 4 contractions with 3 push/pull efforts each; 3 pop offs.  Presentation: vertex; Position: Occiput,, Posterior; Station: +3.  Verbal consent: obtained from patient.  Risks and benefits discussed in detail.  Risks include, but are not limited to the risks of anesthesia, bleeding, infection, damage to maternal tissues, fetal cephalhematoma.  There is also the risk of inability to effect vaginal delivery of the head, or shoulder dystocia that cannot be resolved by established maneuvers, leading to the need for emergency cesarean section.  APGAR: 8, 9; weight pending.   Placenta status: S, I.   3V Cord with the following complications: none.  Cord pH: n/a  Anesthesia:  CLEA Instruments: Kiwi Episiotomy: None Lacerations: 2nd degree Suture Repair: 3.0 vicryl rapide Est. Blood Loss (mL): 487  Mom to postpartum.  Baby to Couplet care / Skin to Skin.  Mitchel Honour 11/29/2019, 7:32 PM

## 2019-09-01 ENCOUNTER — Other Ambulatory Visit: Payer: Self-pay

## 2019-09-01 ENCOUNTER — Emergency Department (HOSPITAL_COMMUNITY): Payer: Managed Care, Other (non HMO)

## 2019-09-01 ENCOUNTER — Encounter (HOSPITAL_COMMUNITY): Payer: Self-pay | Admitting: Emergency Medicine

## 2019-09-01 ENCOUNTER — Emergency Department (HOSPITAL_COMMUNITY)
Admission: EM | Admit: 2019-09-01 | Discharge: 2019-09-01 | Disposition: A | Discharge status: Home / Self Care | Payer: Managed Care, Other (non HMO) | Attending: Emergency Medicine | Admitting: Emergency Medicine

## 2019-09-01 DIAGNOSIS — Z3A26 26 weeks gestation of pregnancy: Secondary | ICD-10-CM | POA: Insufficient documentation

## 2019-09-01 DIAGNOSIS — O99891 Other specified diseases and conditions complicating pregnancy: Secondary | ICD-10-CM | POA: Insufficient documentation

## 2019-09-01 DIAGNOSIS — Z79899 Other long term (current) drug therapy: Secondary | ICD-10-CM | POA: Diagnosis not present

## 2019-09-01 DIAGNOSIS — N200 Calculus of kidney: Secondary | ICD-10-CM | POA: Diagnosis not present

## 2019-09-01 LAB — COMPREHENSIVE METABOLIC PANEL
ALT: 12 U/L (ref 0–44)
AST: 18 U/L (ref 15–41)
Albumin: 3 g/dL — ABNORMAL LOW (ref 3.5–5.0)
Alkaline Phosphatase: 47 U/L (ref 38–126)
Anion gap: 10 (ref 5–15)
BUN: 14 mg/dL (ref 6–20)
CO2: 19 mmol/L — ABNORMAL LOW (ref 22–32)
Calcium: 9 mg/dL (ref 8.9–10.3)
Chloride: 109 mmol/L (ref 98–111)
Creatinine, Ser: 0.89 mg/dL (ref 0.44–1.00)
GFR calc Af Amer: >60 mL/min (ref >60)
GFR calc non Af Amer: >60 mL/min (ref >60)
Glucose, Bld: 115 mg/dL — ABNORMAL HIGH (ref 70–99)
Potassium: 4.3 mmol/L (ref 3.5–5.1)
Sodium: 138 mmol/L (ref 135–145)
Total Bilirubin: 0.4 mg/dL (ref 0.3–1.2)
Total Protein: 6.5 g/dL (ref 6.5–8.1)

## 2019-09-01 LAB — CBC WITH DIFFERENTIAL/PLATELET
Abs Immature Granulocytes: 0.09 10*3/uL — ABNORMAL HIGH (ref 0.00–0.07)
Basophils Absolute: 0 10*3/uL (ref 0.0–0.1)
Basophils Relative: 0 %
Eosinophils Absolute: 0.1 10*3/uL (ref 0.0–0.5)
Eosinophils Relative: 1 %
HCT: 33.5 % — ABNORMAL LOW (ref 36.0–46.0)
Hemoglobin: 11 g/dL — ABNORMAL LOW (ref 12.0–15.0)
Immature Granulocytes: 1 %
Lymphocytes Relative: 11 %
Lymphs Abs: 1.2 10*3/uL (ref 0.7–4.0)
MCH: 31.5 pg (ref 26.0–34.0)
MCHC: 32.8 g/dL (ref 30.0–36.0)
MCV: 96 fL (ref 80.0–100.0)
Monocytes Absolute: 0.6 10*3/uL (ref 0.1–1.0)
Monocytes Relative: 5 %
Neutro Abs: 9.2 10*3/uL — ABNORMAL HIGH (ref 1.7–7.7)
Neutrophils Relative %: 82 %
Platelets: 175 10*3/uL (ref 150–400)
RBC: 3.49 MIL/uL — ABNORMAL LOW (ref 3.87–5.11)
RDW: 13.2 % (ref 11.5–15.5)
WBC: 11.1 10*3/uL — ABNORMAL HIGH (ref 4.0–10.5)
nRBC: 0 % (ref 0.0–0.2)

## 2019-09-01 LAB — URINALYSIS, ROUTINE W REFLEX MICROSCOPIC
Bilirubin Urine: NEGATIVE
Glucose, UA: NEGATIVE mg/dL
Ketones, ur: NEGATIVE mg/dL
Nitrite: NEGATIVE
Protein, ur: NEGATIVE mg/dL
RBC / HPF: >50 RBC/hpf — ABNORMAL HIGH (ref 0–5)
Specific Gravity, Urine: 1.018 (ref 1.005–1.030)
Squamous Epithelial / LPF: >50 — ABNORMAL HIGH (ref 0–5)
pH: 6 (ref 5.0–8.0)

## 2019-09-01 MED ORDER — SODIUM CHLORIDE 0.9 % IV BOLUS (SEPSIS)
1000.0000 mL | Freq: Once | INTRAVENOUS | Status: AC
  Administered 2019-09-01: 1000 mL via INTRAVENOUS

## 2019-09-01 MED ORDER — METOCLOPRAMIDE HCL 5 MG/ML IJ SOLN
10.0000 mg | Freq: Once | INTRAMUSCULAR | Status: AC
  Administered 2019-09-01: 10 mg via INTRAVENOUS
  Filled 2019-09-01: qty 2

## 2019-09-01 MED ORDER — TAMSULOSIN HCL 0.4 MG PO CAPS: 0.4000 mg | ORAL_CAPSULE | Freq: Every day | ORAL | 0 refills | Status: DC

## 2019-09-01 MED ORDER — METOCLOPRAMIDE HCL 10 MG PO TABS: 10.0000 mg | ORAL_TABLET | Freq: Four times a day (QID) | ORAL | 0 refills | Status: DC | PRN

## 2019-09-01 MED ORDER — MORPHINE SULFATE (PF) 4 MG/ML IV SOLN
4.0000 mg | Freq: Once | INTRAVENOUS | Status: AC
  Administered 2019-09-01: 4 mg via INTRAVENOUS
  Filled 2019-09-01: qty 1

## 2019-09-01 MED ORDER — HYDROMORPHONE HCL 1 MG/ML IJ SOLN
1.0000 mg | Freq: Once | INTRAMUSCULAR | Status: AC
  Administered 2019-09-01: 1 mg via INTRAVENOUS
  Filled 2019-09-01: qty 1

## 2019-09-01 MED ORDER — OXYCODONE-ACETAMINOPHEN 5-325 MG PO TABS: 2.0000 | ORAL_TABLET | Freq: Four times a day (QID) | ORAL | 0 refills | Status: DC | PRN

## 2019-09-01 NOTE — ED Provider Notes (Signed)
TIME SEEN: 3:15 AM  CHIEF COMPLAINT: Left flank pain  HPI: Patient is a 29 year old G2, P1 who is approximately [redacted] weeks pregnant followed by Dr. Al[facility] who presents to the emergency department with left flank pain that started suddenly last night.  Has had nausea and vomiting.  Symptoms feel similar to previous kidney stone.  Followed by [facility].  Has never had surgical intervention for her stones.  No fevers.  Has had nausea vomiting.  No diarrhea.  No dysuria, hematuria, vaginal bleeding or discharge.  States she is still feeling baby move normally.  No vaginal bleeding, leaking fluid, contractions.  Left flank pain does radiate into the left lower quadrant.  Took Tylenol at home without relief.  ROS: See HPI Constitutional: no fever  Eyes: no drainage  ENT: no runny nose   Cardiovascular:  no chest pain  Resp: no SOB  GI:  vomiting GU: no dysuria Integumentary: no rash  Allergy: no hives  Musculoskeletal: no leg swelling  Neurological: no slurred speech ROS otherwise negative  PAST MEDICAL HISTORY/PAST SURGICAL HISTORY:  Past Medical History:  Diagnosis Date  . Anxiety   . Bipolar 2 disorder (HCC)    on meds, good control- doing well  . Bipolar disorder ([place])   . Headache   . Kidney stone     MEDICATIONS:  Prior to Admission medications   Medication Sig Start Date End Date Taking? Authorizing Provider  acetaminophen (TYLENOL) 325 MG tablet Take 650 mg by mouth every 6 (six) hours as needed for moderate pain.   Yes [provider]  busPIRone (BUSPAR) 5 MG tablet Take 5 mg by mouth 2 (two) times daily.    Yes [provider]  ferrous sulfate 325 (65 FE) MG EC tablet Take 325 mg by mouth daily with breakfast.   Yes [provider]  Lurasidone HCl (LATUDA) 60 MG TABS Take 60 mg by mouth every evening.    Yes [provider]  Prenatal MV-Min-FA-Omega-3 (PRENATAL GUMMIES/DHA & FA) 0.4-32.5 MG CHEW Chew 2 tablets  by mouth daily.   Yes [provider]    ALLERGIES:  No Known Allergies  SOCIAL HISTORY:  Social History   Tobacco Use  . Smoking status: Never Smoker  . Smokeless tobacco: Never Used  Substance Use Topics  . Alcohol use: No    FAMILY HISTORY: Family History  Problem Relation Age of Onset  . Seizures Mother   . Cancer Neg Hx   . Heart disease Neg Hx   . Stroke Neg Hx     EXAM: BP (!) 142/99 (BP Location: Right Arm)   Pulse (!) 108   Temp 98.3 F (36.8 C) (Oral)   Resp 18   Ht 5\' 3"  (1.6 m)   Wt 72.6 kg   SpO2 97%   BMI 28.34 kg/m  CONSTITUTIONAL: Alert and oriented and responds appropriately to questions. Well-appearing; well-nourished, appears uncomfortable HEAD: Normocephalic EYES: Conjunctivae clear, pupils appear equal, EOM appear intact ENT: normal nose; moist mucous membranes NECK: Supple, normal ROM CARD: RRR; S1 and S2 appreciated; no murmurs, no clicks, no rubs, no gallops RESP: Normal chest excursion without splinting or tachypnea; breath sounds clear and equal bilaterally; no wheezes, no rhonchi, no rales, no hypoxia or respiratory distress, speaking full sentences ABD/GI: Normal bowel sounds; non-distended; soft, gravid uterus, nontender BACK:  The back appears normal, patient has left CVA tenderness, no midline spinal tenderness or step-off or deformity, no redness or warmth, no ecchymosis  or swelling EXT: Normal ROM in all joints; no deformity noted, no edema; no cyanosis SKIN: Normal color for age and race; warm; no rash on exposed skin NEURO: Moves all extremities equally PSYCH: The patient's mood and manner are appropriate.   MEDICAL DECISION MAKING: Patient here with left flank pain that feels similar to previous kidney stones.  Urine shows blood but no other sign of infection.  Urine appears contaminated.  Will obtain renal ultrasound, labs.  [facility] has been contacted to monitor baby.  Will give IV fluids, Reglan.  Patient cannot receive  NSAIDs.  Will give IV morphine for pain control.  We did discuss that narcotics will cross the placenta.  Discussed risk and benefits of these medications.  She agrees to proceed given pain uncontrolled with Tylenol at home.  ED PROGRESS: Patient reports feeling much better and feels comfortable with plan for discharge home.  Ultrasound shows 9 mm right lower pole renal calculus.  She has moderate left hydronephrosis but stone is not visualized.  Urine does not appear infected today.  She initially was hypertensive but this has improved.  Suspect this was due to pain.  No protein in her urine.  Seen by [facility] with normal fetal heart rate with no contractions.  She has been cleared by OB and will be discharged home.  Will discharge with pain medicine, nausea medicine and recommend close outpatient follow-up with urology.  Provided with urine strainer.  She is comfortable with this plan.   At this time, I do not feel there is any life-threatening condition present. I have reviewed, interpreted and discussed all results (EKG, imaging, lab, urine as appropriate) and exam findings with patient/family. I have reviewed nursing notes and appropriate previous records.  I feel the patient is safe to be discharged home without further emergent workup and can continue workup as an outpatient as needed. Discussed usual and customary return precautions. Patient/family verbalize understanding and are comfortable with this plan.  Outpatient follow-up has been provided as needed. All questions have been answered.       Anthonette Lesage was evaluated in Emergency Department on 09/01/2019 for the symptoms described in the history of present illness. She was evaluated in the context of the global COVID-19 pandemic, which necessitated consideration that the patient might be at risk for infection with the SARS-CoV-2 virus that causes COVID-19. Institutional protocols and algorithms that pertain to the evaluation of patients  at risk for COVID-19 are in a state of rapid change based on information released by regulatory bodies including the CDC and federal and state organizations. These policies and algorithms were followed during the patient's care in the ED.  Patient was seen wearing N95, face shield, gloves.    Bahja Bence, Layla Maw, DO 09/01/19 [phone]

## 2019-09-01 NOTE — ED Notes (Signed)
[facility] at the bedside.

## 2019-09-01 NOTE — Progress Notes (Signed)
0931Isidore Moos called to [facility] for patient 26 weeks c/o left sided flank pain since 2100. Pt rating pain 10/10.   0335: In to see pt. C/o constant left sided flank pain since 2100. Patient states that she had a kidney stone with previous pregnancy and that this pain feels "just like that". BP 116/75, P: 114, R: 19, T: 98.1. Left flank pain rated 10/10- constant and sharp. Pt denies leaking of fluid, and vaginal bleeding. States "baby is moving around a lot like always".   1216: Pt placed on monitor.  0411: Dr. Langston Masker called and notified of pt 26 weeks at [facility] with c/o constant left sided flank pain since 2100. Pt states it is sharp and feels like the pain she had with previous kidney stone which occurred during first pregnancy as well. Pt denies leaking of fluid or vaginal bleeding. Pt states fetal movement is present. Pt also has OB apt on Tuesday. FHR 145 with moderate variability 15x15, no decelerations. No ctx noted on monitor and pt denies any cramping. Pt OB cleared at this time. Pt removed from monitor at this time. Ultrasound at bedside.

## 2019-09-01 NOTE — ED Notes (Signed)
PT DISCHARGED. INSTRUCTIONS AND PRESCRIPTIONS GIVEN. AAOX4. PT IN NO APPARENT DISTRESS WITH MILD PAIN. THE OPPORTUNITY TO ASK QUESTIONS WAS PROVIDED. 

## 2019-09-01 NOTE — ED Triage Notes (Signed)
Pt reports left sided flank pain that started around 2100. Pt is currently [redacted]weeks pregnant.

## 2019-09-01 NOTE — ED Notes (Signed)
US at the bedside

## 2019-09-02 LAB — URINE CULTURE

## 2019-11-06 LAB — OB RESULTS CONSOLE GBS: GBS: NEGATIVE

## 2019-11-19 ENCOUNTER — Encounter (HOSPITAL_COMMUNITY): Payer: Self-pay | Admitting: *Deleted

## 2019-11-19 ENCOUNTER — Telehealth (HOSPITAL_COMMUNITY): Payer: Self-pay | Admitting: *Deleted

## 2019-11-19 NOTE — Telephone Encounter (Signed)
Preadmission screen  

## 2019-11-27 ENCOUNTER — Other Ambulatory Visit (HOSPITAL_COMMUNITY)
Admission: RE | Admit: 2019-11-27 | Discharge: 2019-11-27 | Disposition: A | Discharge status: Home / Self Care | Payer: Managed Care, Other (non HMO) | Source: Ambulatory Visit | Attending: Obstetrics & Gynecology | Admitting: Obstetrics & Gynecology

## 2019-11-27 LAB — SARS CORONAVIRUS 2 (TAT 6-24 HRS): SARS Coronavirus 2: NEGATIVE

## 2019-11-29 ENCOUNTER — Inpatient Hospital Stay (HOSPITAL_COMMUNITY): Payer: Managed Care, Other (non HMO) | Admitting: Anesthesiology

## 2019-11-29 ENCOUNTER — Inpatient Hospital Stay (HOSPITAL_COMMUNITY): Payer: Managed Care, Other (non HMO)

## 2019-11-29 ENCOUNTER — Encounter (HOSPITAL_COMMUNITY): Payer: Self-pay | Admitting: Obstetrics & Gynecology

## 2019-11-29 ENCOUNTER — Other Ambulatory Visit: Payer: Self-pay

## 2019-11-29 ENCOUNTER — Inpatient Hospital Stay (HOSPITAL_COMMUNITY)
Admission: AD | Admit: 2019-11-29 | Discharge: 2019-12-01 | DRG: 806 | Disposition: A | Discharge status: Home / Self Care | Payer: Managed Care, Other (non HMO) | Attending: Obstetrics & Gynecology | Admitting: Obstetrics & Gynecology

## 2019-11-29 DIAGNOSIS — E669 Obesity, unspecified: Secondary | ICD-10-CM | POA: Diagnosis present

## 2019-11-29 DIAGNOSIS — O3663X0 Maternal care for excessive fetal growth, third trimester, not applicable or unspecified: Principal | ICD-10-CM | POA: Diagnosis present

## 2019-11-29 DIAGNOSIS — O99344 Other mental disorders complicating childbirth: Secondary | ICD-10-CM | POA: Diagnosis present

## 2019-11-29 DIAGNOSIS — Z6791 Unspecified blood type, Rh negative: Secondary | ICD-10-CM | POA: Diagnosis not present

## 2019-11-29 DIAGNOSIS — O99214 Obesity complicating childbirth: Secondary | ICD-10-CM | POA: Diagnosis present

## 2019-11-29 DIAGNOSIS — F3181 Bipolar II disorder: Secondary | ICD-10-CM | POA: Diagnosis present

## 2019-11-29 DIAGNOSIS — O26893 Other specified pregnancy related conditions, third trimester: Secondary | ICD-10-CM | POA: Diagnosis present

## 2019-11-29 DIAGNOSIS — Z20822 Contact with and (suspected) exposure to covid-19: Secondary | ICD-10-CM | POA: Diagnosis present

## 2019-11-29 DIAGNOSIS — Z3A39 39 weeks gestation of pregnancy: Secondary | ICD-10-CM

## 2019-11-29 DIAGNOSIS — Z3490 Encounter for supervision of normal pregnancy, unspecified, unspecified trimester: Secondary | ICD-10-CM

## 2019-11-29 LAB — CBC
HCT: 33.3 % — ABNORMAL LOW (ref 36.0–46.0)
Hemoglobin: 10.8 g/dL — ABNORMAL LOW (ref 12.0–15.0)
MCH: 30.2 pg (ref 26.0–34.0)
MCHC: 32.4 g/dL (ref 30.0–36.0)
MCV: 93 fL (ref 80.0–100.0)
Platelets: 154 10*3/uL (ref 150–400)
RBC: 3.58 MIL/uL — ABNORMAL LOW (ref 3.87–5.11)
RDW: 13.2 % (ref 11.5–15.5)
WBC: 7.9 10*3/uL (ref 4.0–10.5)
nRBC: 0 % (ref 0.0–0.2)

## 2019-11-29 LAB — RPR: RPR Ser Ql: NONREACTIVE

## 2019-11-29 LAB — TYPE AND SCREEN
ABO/RH(D): O NEG
Antibody Screen: POSITIVE

## 2019-11-29 MED ORDER — LIDOCAINE HCL (PF) 1 % IJ SOLN
INTRAMUSCULAR | Status: DC | PRN
  Administered 2019-11-29: 2 mL via EPIDURAL
  Administered 2019-11-29: 10 mL via EPIDURAL

## 2019-11-29 MED ORDER — MISOPROSTOL 25 MCG QUARTER TABLET
25.0000 ug | ORAL_TABLET | ORAL | Status: DC | PRN
  Administered 2019-11-29 (×2): 25 ug via VAGINAL
  Filled 2019-11-29 (×2): qty 1

## 2019-11-29 MED ORDER — DIPHENHYDRAMINE HCL 25 MG PO CAPS: 25.0000 mg | ORAL_CAPSULE | Freq: Four times a day (QID) | ORAL | Status: DC | PRN

## 2019-11-29 MED ORDER — IBUPROFEN 600 MG PO TABS
600.0000 mg | ORAL_TABLET | Freq: Four times a day (QID) | ORAL | Status: DC
  Administered 2019-11-30 – 2019-12-01 (×7): 600 mg via ORAL
  Filled 2019-11-29 (×7): qty 1

## 2019-11-29 MED ORDER — BUSPIRONE HCL 5 MG PO TABS
5.0000 mg | ORAL_TABLET | Freq: Two times a day (BID) | ORAL | Status: DC
  Administered 2019-11-29: 5 mg via ORAL
  Filled 2019-11-29 (×3): qty 1

## 2019-11-29 MED ORDER — BENZOCAINE-MENTHOL 20-0.5 % EX AERO
1.0000 "application " | INHALATION_SPRAY | CUTANEOUS | Status: DC | PRN
  Administered 2019-11-30 – 2019-12-01 (×2): 1 via TOPICAL
  Filled 2019-11-29 (×2): qty 56

## 2019-11-29 MED ORDER — SIMETHICONE 80 MG PO CHEW: 80.0000 mg | CHEWABLE_TABLET | ORAL | Status: DC | PRN

## 2019-11-29 MED ORDER — ACETAMINOPHEN 325 MG PO TABS
650.0000 mg | ORAL_TABLET | ORAL | Status: DC | PRN
  Administered 2019-11-29: 650 mg via ORAL
  Filled 2019-11-29: qty 2

## 2019-11-29 MED ORDER — PRENATAL MULTIVITAMIN CH
1.0000 | ORAL_TABLET | Freq: Every day | ORAL | Status: DC
  Administered 2019-11-30 – 2019-12-01 (×2): 1 via ORAL
  Filled 2019-11-29 (×2): qty 1

## 2019-11-29 MED ORDER — ZOLPIDEM TARTRATE 5 MG PO TABS: 5.0000 mg | ORAL_TABLET | Freq: Every evening | ORAL | Status: DC | PRN

## 2019-11-29 MED ORDER — OXYTOCIN BOLUS FROM INFUSION
500.0000 mL | Freq: Once | INTRAVENOUS | Status: AC
  Administered 2019-11-29: 500 mL via INTRAVENOUS

## 2019-11-29 MED ORDER — TETANUS-DIPHTH-ACELL PERTUSSIS 5-2.5-18.5 LF-MCG/0.5 IM SUSP: 0.5000 mL | Freq: Once | INTRAMUSCULAR | Status: DC

## 2019-11-29 MED ORDER — OXYTOCIN 40 UNITS IN NORMAL SALINE INFUSION - SIMPLE MED
1.0000 m[IU]/min | INTRAVENOUS | Status: DC
  Administered 2019-11-29: 2 m[IU]/min via INTRAVENOUS
  Filled 2019-11-29: qty 1000

## 2019-11-29 MED ORDER — ONDANSETRON HCL 4 MG/2ML IJ SOLN: 4.0000 mg | Freq: Four times a day (QID) | INTRAMUSCULAR | Status: DC | PRN

## 2019-11-29 MED ORDER — DIPHENHYDRAMINE HCL 50 MG/ML IJ SOLN: 12.5000 mg | INTRAMUSCULAR | Status: DC | PRN

## 2019-11-29 MED ORDER — WITCH HAZEL-GLYCERIN EX PADS: 1.0000 "application " | MEDICATED_PAD | CUTANEOUS | Status: DC | PRN

## 2019-11-29 MED ORDER — LIDOCAINE HCL (PF) 1 % IJ SOLN: 30.0000 mL | INTRAMUSCULAR | Status: DC | PRN

## 2019-11-29 MED ORDER — SOD CITRATE-CITRIC ACID 500-334 MG/5ML PO SOLN: 30.0000 mL | ORAL | Status: DC | PRN

## 2019-11-29 MED ORDER — FENTANYL CITRATE (PF) 100 MCG/2ML IJ SOLN: 50.0000 ug | INTRAMUSCULAR | Status: DC | PRN

## 2019-11-29 MED ORDER — TERBUTALINE SULFATE 1 MG/ML IJ SOLN: 0.2500 mg | Freq: Once | INTRAMUSCULAR | Status: DC | PRN

## 2019-11-29 MED ORDER — LACTATED RINGERS IV SOLN: 500.0000 mL | INTRAVENOUS | Status: DC | PRN

## 2019-11-29 MED ORDER — OXYCODONE-ACETAMINOPHEN 5-325 MG PO TABS: 2.0000 | ORAL_TABLET | ORAL | Status: DC | PRN

## 2019-11-29 MED ORDER — ONDANSETRON HCL 4 MG PO TABS: 4.0000 mg | ORAL_TABLET | ORAL | Status: DC | PRN

## 2019-11-29 MED ORDER — DIBUCAINE (PERIANAL) 1 % EX OINT: 1.0000 "application " | TOPICAL_OINTMENT | CUTANEOUS | Status: DC | PRN

## 2019-11-29 MED ORDER — PHENYLEPHRINE 40 MCG/ML (10ML) SYRINGE FOR IV PUSH (FOR BLOOD PRESSURE SUPPORT): 80.0000 ug | PREFILLED_SYRINGE | INTRAVENOUS | Status: DC | PRN

## 2019-11-29 MED ORDER — EPHEDRINE 5 MG/ML INJ: 10.0000 mg | INTRAVENOUS | Status: DC | PRN

## 2019-11-29 MED ORDER — ONDANSETRON HCL 4 MG/2ML IJ SOLN: 4.0000 mg | INTRAMUSCULAR | Status: DC | PRN

## 2019-11-29 MED ORDER — LACTATED RINGERS IV SOLN
500.0000 mL | Freq: Once | INTRAVENOUS | Status: AC
  Administered 2019-11-29: 10:00:00 500 mL via INTRAVENOUS

## 2019-11-29 MED ORDER — SODIUM CHLORIDE (PF) 0.9 % IJ SOLN
INTRAMUSCULAR | Status: DC | PRN
  Administered 2019-11-29: 12 mL/h via EPIDURAL

## 2019-11-29 MED ORDER — OXYCODONE-ACETAMINOPHEN 5-325 MG PO TABS: 1.0000 | ORAL_TABLET | ORAL | Status: DC | PRN

## 2019-11-29 MED ORDER — LACTATED RINGERS IV SOLN: INTRAVENOUS | Status: DC

## 2019-11-29 MED ORDER — COCONUT OIL OIL
1.0000 "application " | TOPICAL_OIL | Status: DC | PRN
  Administered 2019-12-01: 1 via TOPICAL

## 2019-11-29 MED ORDER — ACETAMINOPHEN 325 MG PO TABS
650.0000 mg | ORAL_TABLET | ORAL | Status: DC | PRN
  Administered 2019-11-30: 650 mg via ORAL
  Filled 2019-11-29: qty 2

## 2019-11-29 MED ORDER — FENTANYL-BUPIVACAINE-NACL 0.5-0.125-0.9 MG/250ML-% EP SOLN
12.0000 mL/h | EPIDURAL | Status: DC | PRN
  Filled 2019-11-29: qty 250

## 2019-11-29 MED ORDER — LURASIDONE HCL 20 MG PO TABS
60.0000 mg | ORAL_TABLET | Freq: Every evening | ORAL | Status: DC
  Administered 2019-11-29: 60 mg via ORAL
  Filled 2019-11-29 (×2): qty 3

## 2019-11-29 MED ORDER — OXYTOCIN 40 UNITS IN NORMAL SALINE INFUSION - SIMPLE MED: 2.5000 [IU]/h | INTRAVENOUS | Status: DC

## 2019-11-29 MED ORDER — SENNOSIDES-DOCUSATE SODIUM 8.6-50 MG PO TABS
2.0000 | ORAL_TABLET | ORAL | Status: DC
  Administered 2019-11-30 (×2): 2 via ORAL
  Filled 2019-11-29 (×2): qty 2

## 2019-11-29 NOTE — Anesthesia Procedure Notes (Signed)
Epidural Patient location during procedure: OB Start time: 11/29/2019 9:50 AM End time: 11/29/2019 10:00 AM  Staffing Anesthesiologist: Lannie Fields, DO Performed: anesthesiologist   Preanesthetic Checklist Completed: patient identified, IV checked, risks and benefits discussed, monitors and equipment checked, pre-op evaluation and timeout performed  Epidural Patient position: sitting Prep: DuraPrep and site prepped and draped Patient monitoring: continuous pulse ox, blood pressure, heart rate and cardiac monitor Approach: midline Location: L3-L4 Injection technique: LOR air  Needle:  Needle type: Tuohy  Needle gauge: 17 G Needle length: 9 cm Needle insertion depth: 5 cm Catheter type: closed end flexible Catheter size: 19 Gauge Catheter at skin depth: 10 cm Test dose: negative  Assessment Sensory level: T8 Events: blood not aspirated, injection not painful, no injection resistance, no paresthesia and negative IV test  Additional Notes Patient identified. Risks/Benefits/Options discussed with patient including but not limited to bleeding, infection, nerve damage, paralysis, failed block, incomplete pain control, headache, blood pressure changes, nausea, vomiting, reactions to medication both or allergic, itching and postpartum back pain. Confirmed with bedside nurse the patient's most recent platelet count. Confirmed with patient that they are not currently taking any anticoagulation, have any bleeding history or any family history of bleeding disorders. Patient expressed understanding and wished to proceed. All questions were answered. Sterile technique was used throughout the entire procedure. Please see nursing notes for vital signs. Test dose was given through epidural catheter and negative prior to continuing to dose epidural or start infusion. Warning signs of high block given to the patient including shortness of breath, tingling/numbness in hands, complete motor  block, or any concerning symptoms with instructions to call for help. Patient was given instructions on fall risk and not to get out of bed. All questions and concerns addressed with instructions to call with any issues or inadequate analgesia.  Reason for block:procedure for pain

## 2019-11-29 NOTE — Lactation Note (Addendum)
This note was copied from a baby's chart. Lactation Consultation Note  Patient Name: Kathleen Bass Date: 11/29/2019  P1, 3 hour term female infant. Mom's feeding choice is breast and formula feeding. Mom with hx of bi-polar disorder on L3-Latuda infant's concerns with this medication:  Is infant sedation, apena,  infant not waking to feed poor feeder. LC discussed other  Antipsychotic alternatives to Latuda such as : Quetiapine and Risperidone which are L2-safe with breastfeeding. LC will attempt to follow up later tonight or early morning with mom and infant, RN help mom earlier with latching infant at breast.     Maternal Data    Feeding Feeding Type: Breast Fed  LATCH Score Latch: Grasps breast easily, tongue down, lips flanged, rhythmical sucking.  Audible Swallowing: A few with stimulation  Type of Nipple: Everted at rest and after stimulation  Comfort (Breast/Nipple): Soft / non-tender  Hold (Positioning): Assistance needed to correctly position infant at breast and maintain latch.  LATCH Score: 8  Interventions    Lactation Tools Discussed/Used     Consult Status      Danelle Earthly 11/29/2019, 10:33 PM

## 2019-11-29 NOTE — Progress Notes (Signed)
Kathleen Bass is a 29 y.o. G2P1001 at [redacted]w[redacted]d by ultrasound admitted for induction of labor due to Elective at term.  Subjective: Comfortable with CLEA  Objective: BP 135/82   Pulse (!) 102   Temp 98.3 F (36.8 C) (Oral)   Resp 14   Ht 5\' 3"  (1.6 m)   Wt 93.8 kg   SpO2 97%   BMI 36.63 kg/m  No intake/output data recorded. No intake/output data recorded.  FHT:  FHR: 135 bpm, variability: moderate,  accelerations:  Present,  decelerations:  Absent UC:   regular, every 2 minutes SVE:   Dilation: 3 Effacement (%): 70, 80 Station: -2 Exam by:: Dr. [email or web address]  AROM, clear  Labs: Lab Results  Component Value Date   WBC 7.9 11/29/2019   HGB 10.8 (L) 11/29/2019   HCT 33.3 (L) 11/29/2019   MCV 93.0 11/29/2019   PLT 154 11/29/2019    Assessment / Plan: Induction of labor due to maternal request,  progressing well on pitocin  Labor: Progressing normally Preeclampsia:  n/a Fetal Wellbeing:  Category I Pain Control:  Epidural I/D:  n/a Anticipated MOD:  NSVD  12/01/2019 11/29/2019, 2:29 PM

## 2019-11-29 NOTE — H&P (Signed)
Kathleen Bass is a 29 y.o. female G2P1001 at 28 weeks presenting for elective IOL.  Patient received second dose of VMP at 0510 and is starting to feel mild CTX; no VB or LOF.  Active FM.  Antepartum course complicated by Bipolar II d/o-stable on Latuda.  Patient was diagnosed with right nephrolithiasis 08/2019 and has been stable since passing stone.  Last u/s on 4/5 EFW 7#12 and large AC.  GBS negative.  Rh negative.  OB History    Gravida  2   Para  1   Term  1   Preterm      AB      Living  1     SAB      TAB      Ectopic      Multiple  0   Live Births  1          Past Medical History:  Diagnosis Date  . Anxiety   . Bipolar 2 disorder (HCC)    on meds, good control- doing well  . Bipolar disorder (HCC)   . Headache   . Kidney stone    Past Surgical History:  Procedure Laterality Date  . NO PAST SURGERIES    . WISDOM TOOTH EXTRACTION  2014   Family History: family history includes Asthma in her sister; Seizures in her mother. Social History:  reports that she has never smoked. She has never used smokeless tobacco. She reports that she does not drink alcohol or use drugs.     Maternal Diabetes: No Genetic Screening: Normal Maternal Ultrasounds/Referrals: Normal Fetal Ultrasounds or other Referrals:  None Maternal Substance Abuse:  No Significant Maternal Medications:  Meds include: Other: Latuda Significant Maternal Lab Results:  Group B Strep negative and Rh negative Other Comments:  None  Review of Systems Maternal Medical History:  Contractions: Onset was 3-5 hours ago.   Frequency: rare.   Perceived severity is mild.    Fetal activity: Perceived fetal activity is normal.   Last perceived fetal movement was within the past hour.    Prenatal complications: no prenatal complications Prenatal Complications - Diabetes: none.    Dilation: 1 Effacement (%): 30 Station: -3 Exam by:: Bre Price RN Blood pressure 125/77, pulse (!) 102,  temperature 98 F (36.7 C), temperature source Oral, resp. rate 15, height 5\' 3"  (1.6 m), weight 93.8 kg, unknown if currently breastfeeding. Maternal Exam:  Uterine Assessment: Contraction strength is mild.  Contraction frequency is irregular.   Abdomen: Patient reports no abdominal tenderness. Fundal height is S>D.   Estimated fetal weight is 8#12.       Fetal Exam Fetal Monitor Review: Baseline rate: 140.  Variability: moderate (6-25 bpm).   Pattern: accelerations present and no decelerations.    Fetal State Assessment: Category I - tracings are normal.     Physical Exam  Constitutional: She is oriented to person, place, and time. She appears well-developed and well-nourished.  GI: Soft. There is no rebound and no guarding.  Musculoskeletal:        General: Normal range of motion.  Neurological: She is alert and oriented to person, place, and time.  Skin: Skin is warm and dry.  Psychiatric: She has a normal mood and affect. Her behavior is normal.    Prenatal labs: ABO, Rh: --/--/O NEG (04/23 0020) Antibody: POS (04/23 0020) Rubella: Immune (09/22 0000) RPR:   NR HBsAg: Negative (09/22 0000)  HIV: Non-reactive (09/22 0000)  GBS: Negative/-- (03/31 0000)   Assessment/Plan:  29yo G2P1001 at 39 weeks for elective IOL -Recheck cvx at 0900 to decide how to proceed with induction -CLEA planned -Given LGA, closely follow labor curve  Linda Hedges 11/29/2019, 8:12 AM

## 2019-11-29 NOTE — Progress Notes (Signed)
Kathleen Bass is a 29 y.o. G2P1001 at [redacted]w[redacted]d by ultrasound admitted for induction of labor due to Elective at term.  Subjective: No c/o  Objective: BP (!) 141/88   Pulse (!) 104   Temp 98.3 F (36.8 C) (Oral)   Resp 16   Ht 5\' 3"  (1.6 m)   Wt 93.8 kg   SpO2 97%   BMI 36.63 kg/m  No intake/output data recorded. Total I/O In: 1801.5 [I.V.:1773.5; Other:28] Out: -   FHT:  FHR: 125 bpm, variability: moderate,  accelerations:  Abscent,  decelerations:  Absent UC:   Difficult to monitor SVE:   Dilation: 3 Effacement (%): 70, 80 Station: -2 Exam by:: Dr. [email or web address]  SVE 5/90/-1, IUPC placed  Labs: Lab Results  Component Value Date   WBC 7.9 11/29/2019   HGB 10.8 (L) 11/29/2019   HCT 33.3 (L) 11/29/2019   MCV 93.0 11/29/2019   PLT 154 11/29/2019    Assessment / Plan: Induction of labor due to maternal request,  progressing well on pitocin  Labor: Progressing normally Preeclampsia:  n/a Fetal Wellbeing:  Category I Pain Control:  Epidural I/D:  n/a Anticipated MOD:  NSVD  12/01/2019 11/29/2019, 4:37 PM

## 2019-11-29 NOTE — Anesthesia Preprocedure Evaluation (Signed)
Anesthesia Evaluation  Patient identified by MRN, date of birth, ID band Patient awake    Reviewed: Allergy & Precautions, H&P , Patient's Chart, lab work & pertinent test results  Airway Mallampati: II  TM Distance: >3 FB Neck ROM: full    Dental no notable dental hx. (+) Teeth Intact   Pulmonary neg pulmonary ROS,    Pulmonary exam normal breath sounds clear to auscultation       Cardiovascular negative cardio ROS Normal cardiovascular exam Rhythm:regular Rate:Normal     Neuro/Psych  Headaches, PSYCHIATRIC DISORDERS Anxiety Bipolar Disorder    GI/Hepatic negative GI ROS, Neg liver ROS,   Endo/Other  Obesity BMI 36  Renal/GU   negative genitourinary   Musculoskeletal negative musculoskeletal ROS (+)   Abdominal (+) + obese,   Peds  Hematology negative hematology ROS (+) hct 33.3, plt 154   Anesthesia Other Findings   Reproductive/Obstetrics (+) Pregnancy                             Anesthesia Physical Anesthesia Plan  ASA: III and emergent  Anesthesia Plan: Epidural   Post-op Pain Management:    Induction:   PONV Risk Score and Plan: 2  Airway Management Planned: Natural Airway  Additional Equipment: None  Intra-op Plan:   Post-operative Plan:   Informed Consent: I have reviewed the patients History and Physical, chart, labs and discussed the procedure including the risks, benefits and alternatives for the proposed anesthesia with the patient or authorized representative who has indicated his/her understanding and acceptance.       Plan Discussed with:   Anesthesia Plan Comments:         Anesthesia Quick Evaluation

## 2019-11-30 LAB — CBC
HCT: 28.9 % — ABNORMAL LOW (ref 36.0–46.0)
Hemoglobin: 9.2 g/dL — ABNORMAL LOW (ref 12.0–15.0)
MCH: 29.8 pg (ref 26.0–34.0)
MCHC: 31.8 g/dL (ref 30.0–36.0)
MCV: 93.5 fL (ref 80.0–100.0)
Platelets: 126 10*3/uL — ABNORMAL LOW (ref 150–400)
RBC: 3.09 MIL/uL — ABNORMAL LOW (ref 3.87–5.11)
RDW: 13.3 % (ref 11.5–15.5)
WBC: 10.4 10*3/uL (ref 4.0–10.5)
nRBC: 0 % (ref 0.0–0.2)

## 2019-11-30 MED ORDER — LURASIDONE HCL 20 MG PO TABS
60.0000 mg | ORAL_TABLET | Freq: Every day | ORAL | Status: DC
  Administered 2019-11-30: 22:00:00 60 mg via ORAL
  Filled 2019-11-30: qty 3

## 2019-11-30 MED ORDER — BUSPIRONE HCL 10 MG PO TABS
10.0000 mg | ORAL_TABLET | Freq: Every day | ORAL | Status: DC
  Administered 2019-11-30: 22:00:00 10 mg via ORAL
  Filled 2019-11-30: qty 1

## 2019-11-30 NOTE — Lactation Note (Signed)
This note was copied from a baby's chart. Lactation Consultation Note  Patient Name: Kathleen Bass [id number] Date: 11/30/2019 Reason for consult: Initial assessment;1st time breastfeeding;Term P1, 7 hour term female infant. Per mom, she has DEBP at home. Mom will discussed with Pediatrician about alternate medications that are L2 safe-  Quetiapine and Risperidone with breastfeeding, mom currently on L3-Latuda. Infant had one void and one stool while LC in room, LC changed infant's diaper, infant had total of two voids since birth.  Per mom, infant latched well in L&D for 15 minutes, she made an attempt earlier infant was sleepy but would not latch. Per mom, she will be home she will not be going to work, plans to breastfed infant.  LC entered room infant was cuing to feed. LC discussed hand expression and mom taught back easily expressing 5 mls of colostrum that was spoon fed to infant. After few minutes infant had a large emesis clear mucous, void and stool. Infant afterwards started cuing, mom latched infant on her right breast using the football hold position, infant latched wide mouth gap, swallows heard and was still breastfeeding after 15 minutes when LC left the room.  Mom understands to breast feed infant according to hunger cues, on demand and not to exceed 3 hours without breastfeeding infant. Mom knows to ask RN or LC if she has any questions, concerns or need further assistance with latching infant at breast. Reviewed Baby & Me book's Breastfeeding Basics.  Mom made aware of O/P services, breastfeeding support groups, community resources, and our phone # for post-discharge questions.   Maternal Data Formula Feeding for Exclusion: No Has patient been taught Hand Expression?: Yes Does the patient have breastfeeding experience prior to this delivery?: No  Feeding Feeding Type: Breast Fed  LATCH Score Latch: Grasps breast easily, tongue down, lips flanged, rhythmical  sucking.  Audible Swallowing: Spontaneous and intermittent  Type of Nipple: Everted at rest and after stimulation  Comfort (Breast/Nipple): Soft / non-tender  Hold (Positioning): Assistance needed to correctly position infant at breast and maintain latch.  LATCH Score: 9  Interventions Interventions: Breast feeding basics reviewed;Breast compression;Adjust position;Assisted with latch;Support pillows;Skin to skin;Position options;Breast massage;Hand express;Expressed milk  Lactation Tools Discussed/Used WIC Program: No   Consult Status Consult Status: Follow-up Date: 11/30/19 Follow-up type: In-patient    Danelle Earthly 11/30/2019, 2:33 AM

## 2019-11-30 NOTE — Progress Notes (Signed)
Post Partum Day 1 Subjective: no complaints, up ad lib, voiding and tolerating PO.  Patient desires circ.  Objective: Blood pressure 131/86, pulse 88, temperature 98 F (36.7 C), resp. rate 20, height 5\' 3"  (1.6 m), weight 93.8 kg, SpO2 99 %, unknown if currently breastfeeding.  Physical Exam:  General: alert, cooperative and appears stated age Lochia: appropriate Uterine Fundus: firm Incision: healing well, no significant drainage, no dehiscence DVT Evaluation: No evidence of DVT seen on physical exam. Negative Homan's sign. No cords or calf tenderness.  Recent Labs    11/29/19 0036 11/30/19 0536  HGB 10.8* 9.2*  HCT 33.3* 28.9*    Assessment/Plan: Plan for discharge tomorrow, Breastfeeding and Circumcision prior to discharge  Patient is counseled for circ including risk of bleeding, infection, and scarring.  All questions were answered and we will proceed. Bipolar II D/O-Stable on Latuda.  Patient counseled re: risk of Latuda in breast feeding.  Unfortunately, there are not good data to support safety.  Patient breast fed with her last newborn while on Latuda.  She is very concerned about discontinuing or changing her medication as she is unsure of impact to her mood during the postpartum period.  We have made the decision to continue Latuda and patient and her husband plan to closely monitor newborn for signs of sedation.     LOS: 1 day   12-03-1995 11/30/2019, 8:07 AM

## 2019-11-30 NOTE — Lactation Note (Signed)
This note was copied from a baby's chart. Lactation Consultation Note  Patient Name: Kathleen Bass PRXYV'O Date: 11/30/2019  Follow up lactation visit  Mom reports breastfeeding is going well. Mom reports she  had been on same meds with her last baby.  Mom reports Dr. York Spaniel meds were compatible with breastfeeding if mom wanted to breastfeed per mom. Mom said that he was circ'd today and very sleepy.  Mom reports she Did some hand expression and spoon feeding this afternoon.  Urged to always offer the breast first and then try and hand express and spoon feed back all expressed mother milk past bf and /or attempt.  Urged mom to call for lactation assitance if he continued to be sleepy.    Maternal Data    Feeding    LATCH Score                   Interventions    Lactation Tools Discussed/Used     Consult Status      [name] [name] 11/30/2019, 7:11 PM

## 2019-11-30 NOTE — Anesthesia Postprocedure Evaluation (Signed)
Anesthesia Post Note  Patient: Kathleen Bass  Procedure(s) Performed: AN AD HOC LABOR EPIDURAL     Patient location during evaluation: Mother Baby Anesthesia Type: Epidural Level of consciousness: awake and alert Pain management: pain level controlled Vital Signs Assessment: post-procedure vital signs reviewed and stable Respiratory status: spontaneous breathing, nonlabored ventilation and respiratory function stable Cardiovascular status: stable Postop Assessment: no headache, no backache and epidural receding Anesthetic complications: no    Last Vitals:  Vitals:   11/30/19 0255 11/30/19 0619  BP: 131/83 (!) 143/84  Pulse: 87 86  Resp: 18 17  Temp: 36.9 C 36.7 C  SpO2: 96% 99%    Last Pain:  Vitals:   11/30/19 0619  TempSrc: Oral  PainSc: 5    Pain Goal: Patients Stated Pain Goal: 4 (11/29/19 1130)                 Rica Records

## 2019-12-01 MED ORDER — IBUPROFEN 600 MG PO TABS: 600.0000 mg | ORAL_TABLET | Freq: Four times a day (QID) | ORAL | 0 refills | Status: AC

## 2019-12-01 NOTE — Clinical Social Work Maternal (Addendum)
CLINICAL SOCIAL WORK MATERNAL/CHILD NOTE  Patient Details  Name: Kathleen Bass MRN: [id number] Date of Birth: 07-07-1991  Date:  12/01/2019  Clinical Social Worker Initiating Note:  Georgeanne Nim, MSW, LCSWA Date/Time: Initiated:  12/01/19/1050     Child's Name:  Arnette Norris   Biological Parents:  Mother, Father(Micheal Daylene Katayama, 11/01/1989)   Need for Interpreter:  None   Reason for Referral:  Behavioral Health Concerns(Anxiety, Bipolar II)   Address:  [place] [address]    Phone number:  [phone] (home)     Additional phone number: none offered  Household Members/Support Persons (HM/SP):   Household Member/Support Person 1, Household Member/Support Person 2   HM/SP Name Relationship DOB or Age  HM/SP -1 Micheal Daylene Katayama FOB/Husband 11/01/1989  HM/SP -2 Shalon Salado son 04/19/2018  HM/SP -3        HM/SP -4        HM/SP -5        HM/SP -6        HM/SP -7        HM/SP -8          Natural Supports (not living in the home):  Other (Comment), Parent, Friends, Immediate Family(Book[employer])   Professional Supports: Other (Comment)(Psychriatist Dr. Ysidro Evert)   Employment: Unemployed, Homemaker   Type of Work:     Education:  Production designer, theatre/television/film   Homebound arranged:    Museum/gallery curator Resources:  Multimedia programmer   Other Resources:      Cultural/Religious Considerations Which May Impact Care:  none stated  Strengths:  Ability to meet basic needs , Psychotropic Medications, Compliance with medical plan , Pediatrician chosen, Home prepared for child , Understanding of illness   Psychotropic Medications:  Buspar, Latuda      Pediatrician:    [facility] area  Pediatrician List:   [place]  [facility]      Pediatrician Fax Number:    Risk Factors/Current Problems:  Mental Health Concerns    Cognitive State:  Able to Concentrate ,  Alert    Mood/Affect:  Interested , Comfortable , Happy    CSW Assessment: CSW received consult for hx of Anxiety and Bipolar II.  CSW met with MOB to offer support and complete assessment. On arrival, infant Theo and FOB were present, however, after PDD/A and SIDS education, FOB stepped out of room to allow MOB privacy during assessment. MOB and FOB were pleasant and engaged during visit.   MOB reported anxiety, Bipolar II, and self-harm history. MOB denied any current SI, HI, or domestic violence. MOB reports sx have been well-controlled for over 3 years and she continues to be followed by psychiatrist Dr. Ysidro Evert with [facility]. MOB reports consistent compliance with active Rx for Latuda and Buspar. MOB reported upcoming appointment with Dr. Ysidro Evert in 2 weeks to evaluate needs postpartum. MOB denied any postpartum depression or anxiety with other child. MOB reported she is interested in pursuing counseling for additional support. CSW offered list of resources and online information for [employer] Today for additional research options. MOB identified FOB, bookclub friends, parent, sister, and friends as supports. 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 MOB reported she is currently, happy and excited about going home.   MOB denied any substance use or CPS hx or concerns.   CSW provided education regarding the baby  blues period vs. perinatal mood disorders, discussed treatment and gave resources for mental health follow up if concerns arise.  CSW recommends self-evaluation during the postpartum time period using the New Mom Checklist from Postpartum Progress and encouraged MOB and FOB  to contact a medical professional if symptoms are noted at any time.  MOB and FOB denied any questions.   CSW provided review of Sudden Infant Death Syndrome (SIDS) precautions.  MOB confirmed having all needed items for baby including car seat and crib  and bassinet for infant's safe sleep.   CSW identifies no further need for intervention and no barriers to discharge at this time.  CSW Plan/Description:  No Further Intervention Required/No Barriers to Discharge, Sudden Infant Death Syndrome (SIDS) Education, Perinatal Mood and Anxiety Disorder (PMADs) Education, Other Information/Referral to [employer] D. Lissa Morales, MSW, [facility] Clinical Social Worker [phone] 12/01/2019, 11:16 AM

## 2019-12-01 NOTE — Discharge Summary (Signed)
Obstetric Discharge Summary Reason for Admission: induction of labor Prenatal Procedures: none Intrapartum Procedures: spontaneous vaginal delivery and vacuum Postpartum Procedures: none Complications-Operative and Postpartum: 2nd degree perineal laceration Hemoglobin  Date Value Ref Range Status  11/30/2019 9.2 (L) 12.0 - 15.0 g/dL Final   HCT  Date Value Ref Range Status  11/30/2019 28.9 (L) 36.0 - 46.0 % Final    Physical Exam:  General: alert, cooperative and appears stated age 29: appropriate Uterine Fundus: firm Incision: healing well, no significant drainage, no dehiscence DVT Evaluation: No evidence of DVT seen on physical exam. Negative Homan's sign. No cords or calf tenderness.  Discharge Diagnoses: Term Pregnancy-delivered  Discharge Information: Date: 12/01/2019 Activity: pelvic rest Diet: routine Medications: PNV, Ibuprofen and Latuda, Buspar Condition: stable Instructions: refer to practice specific booklet Discharge to: home   Newborn Data: Live born female  Birth Weight: 8 lb 10.8 oz (3935 g) APGAR: 8, 9  Newborn Delivery   Birth date/time: 11/29/2019 19:06:00 Delivery type: Vaginal, Vacuum (Extractor)      Home with mother.  Mitchel Honour 12/01/2019, 9:30 AM

## 2019-12-01 NOTE — Plan of Care (Signed)
  Problem: Clinical Measurements: Goal: Ability to maintain clinical measurements within normal limits will improve Outcome: Completed/Met Goal: Will remain free from infection Outcome: Completed/Met Goal: Diagnostic test results will improve Outcome: Completed/Met Goal: Respiratory complications will improve Outcome: Completed/Met Goal: Cardiovascular complication will be avoided Outcome: Completed/Met   Problem: Activity: Goal: Risk for activity intolerance will decrease Outcome: Completed/Met   Problem: Pain Managment: Goal: General experience of comfort will improve Outcome: Completed/Met   Problem: Safety: Goal: Ability to remain free from injury will improve Outcome: Completed/Met   Problem: Education: Goal: Knowledge of condition will improve Outcome: Completed/Met   Problem: Life Cycle: Goal: Chance of risk for complications during the postpartum period will decrease Outcome: Completed/Met   Problem: Role Relationship: Goal: Ability to demonstrate positive interaction with newborn will improve Outcome: Completed/Met   Problem: Skin Integrity: Goal: Demonstration of wound healing without infection will improve Outcome: Completed/Met

## 2019-12-01 NOTE — Progress Notes (Signed)
Post Partum Day 2 Subjective: no complaints, up ad lib and voiding  Objective: Blood pressure 115/71, pulse 98, temperature 97.9 F (36.6 C), temperature source Oral, resp. rate 18, height 5\' 3"  (1.6 m), weight 93.8 kg, SpO2 98 %, unknown if currently breastfeeding.  Physical Exam:  General: alert, cooperative and appears stated age Lochia: appropriate Uterine Fundus: firm Incision: healing well, no significant drainage, no dehiscence DVT Evaluation: No evidence of DVT seen on physical exam. Negative Homan's sign. No cords or calf tenderness.  Recent Labs    11/29/19 0036 11/30/19 0536  HGB 10.8* 9.2*  HCT 33.3* 28.9*    Assessment/Plan: Discharge home and Breastfeeding   LOS: 2 days   12/02/19 12/01/2019, 9:28 AM

## 2019-12-01 NOTE — Progress Notes (Signed)
CSW acknowledged consult and attempted to meet with MOB. However, bedside nurse was attending to MOB.  CSW will meet with MOB at a later time.  Jalaya Sarver D. Leenah Seidner, MSW, LCSWA Clinical Social Worker [phone] 

## 2019-12-01 NOTE — Lactation Note (Signed)
This note was copied from a baby's chart. Lactation Consultation Note  Patient Name: Kathleen Bass [id number]'[id number] Date: 12/01/2019 Reason for consult: Follow-up assessment;Term;Infant weight loss;Other (Comment)(7 % weight loss) Baby is 61 hours old / for D/C today  Mom reports today has not been able to hand express , but was able to in the beginning.  Baby awake and rooting after the RN and MD's exam.  LC assisted to latch on the right breast with permission and added appropriate support and baby opened wide and latched with depth.  LC checked latch and flipped upper lip to flanged position. Increased swallows noted with breast compressions and moist heat. Mom mentioned with her 1st baby her milk never really came in and never experienced engorgement.  Baby is at 7 % weight loss today .  LC reassured mom and dad baby is latching well with depth and the swallows are encouraging for let down. Mom mentioned she was having cramping with the feeding.  LC recommended when she is at home - prior to every feeding breast massage, hand express,  Pre- pump to prime the milk ducts on the 1st breast to reassure her of swallows and assist with 1st let down, feed for 15 - and if baby is still hungry offer 2nd breast.  Due to no breast changes with this pregnancy or 1st pregnancy, LC recommended supplementing with EBM or formula 30 ml and then post pump both breast with the DEBP.  LC offered to show mom and dad the 5 F SNS at the breast and mom felt it would be to much with having a 68 month old at home. Preferred a bottle.  Mom denies soreness , sore nipple and engorgement prevention and tx reviewed , storage of breast milk. Discussed the importance of STS feedings, nutritive vs non - nutritive feeding patterns.  LC offered to request an LC O/P appt and mom receptive. LC placed a request in the epic Clinic for mom to be called for appt in 5-7 days.  Mom aware she will receive a call from the clinic.    Maternal Data Has patient been taught Hand Expression?: Yes  Feeding Feeding Type: Breast Fed  LATCH Score Latch: Grasps breast easily, tongue down, lips flanged, rhythmical sucking.  Audible Swallowing: Spontaneous and intermittent  Type of Nipple: Everted at rest and after stimulation  Comfort (Breast/Nipple): Soft / non-tender  Hold (Positioning): Assistance needed to correctly position infant at breast and maintain latch.  LATCH Score: 9  Interventions Interventions: Breast feeding basics reviewed;Assisted with latch;Skin to skin;Breast compression;Adjust position;Support pillows;Position options;Expressed milk  Lactation Tools Discussed/Used Pump Review: Milk Storage   Consult Status Consult Status: Follow-up(mom mentioned the previous LC placed a request for Lc O./P F/U) Follow-up type: Out-patient    Kathleen Bass 12/01/2019, 10:32 AM

## 2019-12-01 NOTE — Discharge Instructions (Signed)
Call MD for T>100.4, heavy vaginal bleeding, severe abdominal pain, intractable nausea and/or vomiting, or respiratory distress.  Call office to schedule postpartum visit in 6 weeks.  Pelvic rest x 6 weeks.   

## 2020-02-21 ENCOUNTER — Ambulatory Visit: Payer: Managed Care, Other (non HMO) | Attending: Obstetrics & Gynecology | Admitting: Physical Therapy

## 2020-02-21 ENCOUNTER — Encounter: Payer: Self-pay | Admitting: Physical Therapy

## 2020-02-21 ENCOUNTER — Other Ambulatory Visit: Payer: Self-pay

## 2020-02-21 DIAGNOSIS — M6281 Muscle weakness (generalized): Secondary | ICD-10-CM

## 2020-02-21 DIAGNOSIS — R279 Unspecified lack of coordination: Secondary | ICD-10-CM | POA: Insufficient documentation

## 2020-02-21 NOTE — Therapy (Signed)
[facility] [address], [address] [place], [place], [address] Phone: [phone]   Fax:  [phone]  Physical Therapy Evaluation  Patient Details  Name: Kathleen Bass MRN: [id number] Date of Birth: June 06, 1991 Referring Provider (PT): Mitchel Honour, DO   Encounter Date: 02/21/2020   PT End of Session - 02/21/20 0900    Visit Number 1    Date for PT Re-Evaluation 05/15/20    PT Start Time 0801    PT Stop Time 0838    PT Time Calculation (min) 37 min    Activity Tolerance Patient tolerated treatment well    Behavior During Therapy [facility] for tasks assessed/performed           Past Medical History:  Diagnosis Date  . Anxiety   . Bipolar 2 disorder (HCC)    on meds, good control- doing well  . Bipolar disorder (HCC)   . Headache   . Kidney stone     Past Surgical History:  Procedure Laterality Date  . NO PAST SURGERIES    . WISDOM TOOTH EXTRACTION  2014    There were no vitals filed for this visit.    Subjective Assessment - 02/21/20 0803    Subjective Pt states she has been losing weight since the pregnancy but still feels like she looks 6 months pregnant. Pt had the same issue after first son was born but this time it is even more severe.  Pt also reports a lot harder to have a BM since giving birth.  Stool consistency is normal but is not coming out.    Patient Stated Goals be able to activate abdominal muscles and reduce waste circumference    Currently in Pain? No/denies              [facility] PT Assessment - 02/21/20 0001      Assessment   Medical Diagnosis M62.08 (ICD-10-CM) - Separation of muscle (nontraumatic), other site    Referring Provider (PT) Mitchel Honour, DO    Onset Date/Surgical Date 11/29/19    Prior Therapy No      Precautions   Precautions None      Balance Screen   Has the patient fallen in the past 6 months No      Home Environment   Living Environment Private residence     Living Arrangements Spouse/significant other;Children   2 children sone 13month; son 12 weeks     Prior Function   Level of Independence Independent      Cognition   Overall Cognitive Status Within Functional Limits for tasks assessed      ROM / Strength   AROM / PROM / Strength AROM;Strength      AROM   Overall AROM Comments thoracic ext limited 50%; lumbar flexion limited 25%      Strength   Overall Strength Comments Rt hip abduction/adduction and ext - 4/5      Flexibility   Soft Tissue Assessment /Muscle Length yes    Hamstrings 80%      Special Tests   Other special tests ASLR Rt2/5; Lt1/5 - same with compression                      Objective measurements completed on examination: See above findings.     Pelvic Floor Special Questions - 02/21/20 0001    Prior Pregnancies Yes    Number of Pregnancies 2    Number of Vaginal Deliveries 2    Episiotomy  Performed --   2nd degree tears; feels well healed   Diastasis Recti 2 fingers sup to umbilicus 1 knuckle deep; 3 fingers at umbilicus down to 2 knuckles and closing up inferior to umbilicus    Currently Sexually Active Yes    Is this Painful No    Urinary Leakage No    Urinary urgency No    Urinary frequency normal    Fecal incontinence No   hard to have a BM   Perineal Body/Introitus  Descended    Pelvic Floor Internal Exam pt identity confirmed and informed consent given to perform internal soft tissue assessment    Exam Type Vaginal    Palpation no tenderness slightly higher tone in levators    Strength weak squeeze, no lift    Strength # of reps --   10/10   Strength # of seconds 5    Tone normal                    PT Education - 02/21/20 0859    Education Details Access Code: [email or web address]    Person(s) Educated Patient    Methods Explanation;Demonstration;Handout;Verbal cues    Comprehension Verbalized understanding;Returned demonstration            PT Short Term Goals - 02/21/20  0926      PT SHORT TERM GOAL #1   Title ind with toileting techniques    Time 4    Period Weeks    Status New    Target Date 03/20/20      PT SHORT TERM GOAL #2   Title ind with able to activate TrA    Time 4    Period Weeks    Status New    Target Date 03/20/20             PT Long Term Goals - 02/21/20 0921      PT LONG TERM GOAL #1   Title pt will be able to have a BM without excessive force    Time 12    Period Weeks    Status New    Target Date 05/15/20      PT LONG TERM GOAL #2   Title Pt will be able to demonstrate core strength to be able to perform 10 sec plank on knees.    Period Weeks    Status New    Target Date 05/15/20      PT LONG TERM GOAL #3   Title pt will be able to activate TrA and obliques in order to demonstrate crunch with less than one knuckle depth of DRA    Time 12    Period Weeks    Status New    Target Date 05/15/20      PT LONG TERM GOAL #4   Title pt will be ind with advanced HEP for participation in active lifestyle    Time 12    Status New    Target Date 05/15/20                  Plan - 02/21/20 0901    Clinical Impression Statement Pt presents to clinic due to DRA that began during pregnancy of first child but has worsened since most recent.  Pt has multiple issues stemming from the diminished ability of the core to activate correctly including excessive use of scalenes and upper traps, diaphragm, and pelvic floor.  Pt has diminished power of pelvic floor contraction of 2/5 MMT.  Good  speed, but poor endurance of pelvic floor.  Pt has very little ribcage movement and excessive excursion.  Pt has posture abnormalities and weakness as stated above.  She will benefit from skilled PTto address multiple impairments so she can return to healthy active lifestyle and avoid future complications associated with this lack of coordination and strength in her core.    Personal Factors and Comorbidities Comorbidity 1;Time since onset of  injury/illness/exacerbation    Comorbidities 2 pregnancies    Examination-Activity Limitations Toileting   poor core control with all functional movements   Stability/Clinical Decision Making Evolving/Moderate complexity    Clinical Decision Making Moderate    Rehab Potential Excellent    PT Frequency 2x / week    PT Duration 12 weeks    PT Treatment/Interventions ADLs/Self Care Home Management;Biofeedback;Cryotherapy;Electrical Stimulation;Moist Heat;Manual techniques;Neuromuscular re-education;Therapeutic exercise;Therapeutic activities;Patient/family education;Passive range of motion;Dry needling;Taping    PT Next Visit Plan measure abdomen; toileting techniques, f/u on breathing, STM to back and TrA various positions    PT Home Exercise Plan Access Code: [email or web address]    Consulted and Agree with Plan of Care Patient           Patient will benefit from skilled therapeutic intervention in order to improve the following deficits and impairments:  Increased muscle spasms, Decreased coordination, Decreased strength, Postural dysfunction  Visit Diagnosis: Muscle weakness (generalized)     Problem List Patient Active Problem List   Diagnosis Date Noted  . Pregnancy 11/29/2019  . Indication for care in labor or delivery 04/19/2018  . SVD (spontaneous vaginal delivery) 04/19/2018    Junious Silk, PT 02/21/2020, 9:28 AM  [facility] [address], [address] [place], [place], [address] Phone: [phone]   Fax:  [phone]  Name: Pearle Wandler MRN: [id number] Date of Birth: 1991/05/23

## 2020-02-21 NOTE — Patient Instructions (Signed)
Access Code: [email or web address] URL: [email or web address] Date: 02/21/2020 Prepared by: Dwana Curd  Exercises Supine Diaphragmatic Breathing - 3 x daily - 7 x weekly - 10 reps - 1 sets Hooklying Transversus Abdominis Palpation - 3 x daily - 7 x weekly - 1 sets - 10 reps Thoracic Extension Mobilization with Noodle - 3 x daily - 7 x weekly - 1 sets - 10 reps

## 2020-02-28 ENCOUNTER — Ambulatory Visit: Payer: Managed Care, Other (non HMO) | Admitting: Physical Therapy

## 2020-02-28 ENCOUNTER — Other Ambulatory Visit: Payer: Self-pay

## 2020-02-28 DIAGNOSIS — M6281 Muscle weakness (generalized): Secondary | ICD-10-CM

## 2020-02-28 DIAGNOSIS — R279 Unspecified lack of coordination: Secondary | ICD-10-CM

## 2020-02-28 NOTE — Patient Instructions (Addendum)
Breathing during bowel movement:  1. Back straight - sit up low back to upper back not rounding forward - look straight ahead 2. Lean forward - only as far as possible with back staying straight 3. Breathe - slow as you can inhaling down into your belly and feeling pressure on the pelvic floor 4. Hard belly - keep belly like a hard ball on the max inhale 5. Blow hard - like blowing up a balloon and pressure down into pelvic floor 6. Squeeze and lift - tighten pelvic floor after BM to reset everything back into place   Squatty potty - stool to help posture 20%  [facility] [address] [place], [place] [address] Phone # [phone] Fax [phone]  Access Code: [email or web address] URL: [email or web address] Date: 02/28/2020 Prepared by: Dwana Curd  Exercises Supine Diaphragmatic Breathing - 3 x daily - 7 x weekly - 10 reps - 1 sets Hooklying Transversus Abdominis Palpation - 3 x daily - 7 x weekly - 1 sets - 10 reps Thoracic Extension Mobilization with Noodle - 3 x daily - 7 x weekly - 1 sets - 10 reps Supine Diastasis Recti Correction with Sheet - 1 x daily - 7 x weekly - 3 sets - 10 reps Clamshells with Baby - 1 x daily - 7 x weekly - 3 sets - 10 reps Quadruped Transversus Abdominis Bracing - 1 x daily - 7 x weekly - 10 reps - 2 sets - 5 sec hold

## 2020-02-28 NOTE — Therapy (Signed)
[facility] [address], [address] [place], [place], [address] Phone: [phone]   Fax:  [phone]  Physical Therapy Treatment  Patient Details  Name: Kathleen Bass MRN: [id number] Date of Birth: 1990-09-13 Referring Provider (PT): Mitchel Honour, DO   Encounter Date: 02/28/2020   PT End of Session - 02/28/20 0910    Visit Number 2    Date for PT Re-Evaluation 05/15/20    PT Start Time 0849    PT Stop Time 0929    PT Time Calculation (min) 40 min           Past Medical History:  Diagnosis Date  . Anxiety   . Bipolar 2 disorder (HCC)    on meds, good control- doing well  . Bipolar disorder (HCC)   . Headache   . Kidney stone     Past Surgical History:  Procedure Laterality Date  . NO PAST SURGERIES    . WISDOM TOOTH EXTRACTION  2014    There were no vitals filed for this visit.   Subjective Assessment - 02/28/20 0907    Subjective I have been doing the breathing a lot and getting better at that.  Still having a hard time with tightening core    Patient Stated Goals be able to activate abdominal muscles and reduce waste circumference    Currently in Pain? No/denies                             [facility] Adult PT Treatment/Exercise - 02/28/20 0001      Self-Care   Self-Care Other Self-Care Comments    Other Self-Care Comments  toileting techniques      Neuro Re-ed    Neuro Re-ed Details  TrA activation in various positions and TC      Lumbar Exercises: Supine   AB Set Limitations towel/sheet to activate abdominals    Clam Limitations sidelying withTrA    Other Supine Lumbar Exercises bent knee fallouts - 10x      Lumbar Exercises: Quadruped   Other Quadruped Lumbar Exercises modified qped - TrA activation 5 sec hold      Manual Therapy   Manual Therapy Soft tissue mobilization    Soft tissue mobilization lumbar and thoracic paraspinals                  PT Education -  02/28/20 0933    Education Details Access Code: [email or web address]; breathing with bowel movement    Person(s) Educated Patient    Methods Explanation;Demonstration;Verbal cues;Handout    Comprehension Verbalized understanding;Returned demonstration            PT Short Term Goals - 02/21/20 0926      PT SHORT TERM GOAL #1   Title ind with toileting techniques    Time 4    Period Weeks    Status New    Target Date 03/20/20      PT SHORT TERM GOAL #2   Title ind with able to activate TrA    Time 4    Period Weeks    Status New    Target Date 03/20/20             PT Long Term Goals - 02/21/20 0921      PT LONG TERM GOAL #1   Title pt will be able to have a BM without excessive force    Time 12    Period Weeks  Status New    Target Date 05/15/20      PT LONG TERM GOAL #2   Title Pt will be able to demonstrate core strength to be able to perform 10 sec plank on knees.    Period Weeks    Status New    Target Date 05/15/20      PT LONG TERM GOAL #3   Title pt will be able to activate TrA and obliques in order to demonstrate crunch with less than one knuckle depth of DRA    Time 12    Period Weeks    Status New    Target Date 05/15/20      PT LONG TERM GOAL #4   Title pt will be ind with advanced HEP for participation in active lifestyle    Time 12    Status New    Target Date 05/15/20                 Plan - 02/28/20 1035    Clinical Impression Statement Pt did well with TC and in modified qped for TrA activation and not holding breathing.  This session focused on being able to activate TrA. Pt was measure belly circumference around level at the umbilicus at 103 cm.  Pt will benefit from skilled PT to continue working on muscle coordination and be able to strengthen TrA.    PT Treatment/Interventions ADLs/Self Care Home Management;Biofeedback;Cryotherapy;Electrical Stimulation;Moist Heat;Manual techniques;Neuromuscular re-education;Therapeutic  exercise;Therapeutic activities;Patient/family education;Passive range of motion;Dry needling;Taping    PT Next Visit Plan progress TrA various positions; f/u with toileting    PT Home Exercise Plan Access Code: [email or web address]    Consulted and Agree with Plan of Care Patient           Patient will benefit from skilled therapeutic intervention in order to improve the following deficits and impairments:  Increased muscle spasms, Decreased coordination, Decreased strength, Postural dysfunction  Visit Diagnosis: Muscle weakness (generalized)  Unspecified lack of coordination     Problem List Patient Active Problem List   Diagnosis Date Noted  . Pregnancy 11/29/2019  . Indication for care in labor or delivery 04/19/2018  . SVD (spontaneous vaginal delivery) 04/19/2018    Junious Silk, PT 02/28/2020, 12:20 PM  [facility] [address] [place], [place], [address] Phone: [phone]   Fax:  [phone]  Name: Kathleen Bass MRN: [id number] Date of Birth: 01/29/1991

## 2020-02-28 NOTE — Therapy (Signed)
[facility] [address], [address] [place], [place], [address] Phone: [phone]   Fax:  [phone]  Physical Therapy Treatment  Patient Details  Name: Kathleen Bass MRN: [id number] Date of Birth: October 14, 1990 Referring Provider (PT): Mitchel Honour, DO   Encounter Date: 02/28/2020   PT End of Session - 02/28/20 0910    Visit Number 2    Date for PT Re-Evaluation 05/15/20    PT Start Time 0849    PT Stop Time 0929    PT Time Calculation (min) 40 min           Past Medical History:  Diagnosis Date  . Anxiety   . Bipolar 2 disorder (HCC)    on meds, good control- doing well  . Bipolar disorder (HCC)   . Headache   . Kidney stone     Past Surgical History:  Procedure Laterality Date  . NO PAST SURGERIES    . WISDOM TOOTH EXTRACTION  2014    There were no vitals filed for this visit.   Subjective Assessment - 02/28/20 0907    Subjective I have been doing the breathing a lot and getting better at that.  Still having a hard time with tightening core    Patient Stated Goals be able to activate abdominal muscles and reduce waste circumference    Currently in Pain? No/denies              [facility] PT Assessment - 02/28/20 0001      Special Tests   Other special tests abdomen circumference at level of umbilicus- 103 cm                         [facility] Adult PT Treatment/Exercise - 02/28/20 0001      Self-Care   Self-Care Other Self-Care Comments    Other Self-Care Comments  toileting techniques      Neuro Re-ed    Neuro Re-ed Details  TrA activation in various positions and TC      Lumbar Exercises: Supine   AB Set Limitations towel/sheet to activate abdominals    Clam Limitations sidelying withTrA    Other Supine Lumbar Exercises bent knee fallouts - 10x      Lumbar Exercises: Quadruped   Other Quadruped Lumbar Exercises modified qped - TrA activation 5 sec hold      Manual Therapy    Manual Therapy Soft tissue mobilization    Soft tissue mobilization lumbar and thoracic paraspinals                  PT Education - 02/28/20 0933    Education Details Access Code: [email or web address]; breathing with bowel movement    Person(s) Educated Patient    Methods Explanation;Demonstration;Verbal cues;Handout    Comprehension Verbalized understanding;Returned demonstration            PT Short Term Goals - 02/21/20 0926      PT SHORT TERM GOAL #1   Title ind with toileting techniques    Time 4    Period Weeks    Status New    Target Date 03/20/20      PT SHORT TERM GOAL #2   Title ind with able to activate TrA    Time 4    Period Weeks    Status New    Target Date 03/20/20             PT Long Term Goals - 02/21/20 6384  PT LONG TERM GOAL #1   Title pt will be able to have a BM without excessive force    Time 12    Period Weeks    Status New    Target Date 05/15/20      PT LONG TERM GOAL #2   Title Pt will be able to demonstrate core strength to be able to perform 10 sec plank on knees.    Period Weeks    Status New    Target Date 05/15/20      PT LONG TERM GOAL #3   Title pt will be able to activate TrA and obliques in order to demonstrate crunch with less than one knuckle depth of DRA    Time 12    Period Weeks    Status New    Target Date 05/15/20      PT LONG TERM GOAL #4   Title pt will be ind with advanced HEP for participation in active lifestyle    Time 12    Status New    Target Date 05/15/20                 Plan - 02/28/20 1035    Clinical Impression Statement Pt did well with TC and in modified qped for TrA activation and not holding breathing.  This session focused on being able to activate TrA. Pt was measure belly circumference around level at the umbilicus at 103 cm.  Pt will benefit from skilled PT to continue working on muscle coordination and be able to strengthen TrA.    PT Treatment/Interventions ADLs/Self Care Home  Management;Biofeedback;Cryotherapy;Electrical Stimulation;Moist Heat;Manual techniques;Neuromuscular re-education;Therapeutic exercise;Therapeutic activities;Patient/family education;Passive range of motion;Dry needling;Taping    PT Next Visit Plan progress TrA various positions; f/u with toileting    PT Home Exercise Plan Access Code: [email or web address]    Consulted and Agree with Plan of Care Patient           Patient will benefit from skilled therapeutic intervention in order to improve the following deficits and impairments:  Increased muscle spasms, Decreased coordination, Decreased strength, Postural dysfunction  Visit Diagnosis: Muscle weakness (generalized)  Unspecified lack of coordination     Problem List Patient Active Problem List   Diagnosis Date Noted  . Pregnancy 11/29/2019  . Indication for care in labor or delivery 04/19/2018  . SVD (spontaneous vaginal delivery) 04/19/2018    Junious Silk, PT 02/28/2020, 12:24 PM  [facility] [address], [address] [place], [place], [address] Phone: [phone]   Fax:  [phone]  Name: Kathleen Bass MRN: [id number] Date of Birth: Jun 29, 1991

## 2020-03-06 ENCOUNTER — Ambulatory Visit: Payer: Managed Care, Other (non HMO) | Admitting: Physical Therapy

## 2020-03-06 ENCOUNTER — Encounter: Payer: Self-pay | Admitting: Physical Therapy

## 2020-03-06 ENCOUNTER — Other Ambulatory Visit: Payer: Self-pay

## 2020-03-06 DIAGNOSIS — M6281 Muscle weakness (generalized): Secondary | ICD-10-CM | POA: Diagnosis not present

## 2020-03-06 DIAGNOSIS — R279 Unspecified lack of coordination: Secondary | ICD-10-CM

## 2020-03-06 NOTE — Therapy (Signed)
[facility] [address], [address] [place], [place], [address] Phone: [phone]   Fax:  [phone]  Physical Therapy Treatment  Patient Details  Name: Kathleen Bass MRN: [id number] Date of Birth: 02-16-1991 Referring Provider (PT): Mitchel Honour, DO   Encounter Date: 03/06/2020   PT End of Session - 03/06/20 0843    Visit Number 3    Date for PT Re-Evaluation 05/15/20    PT Start Time 0843    PT Stop Time 0925    PT Time Calculation (min) 42 min    Activity Tolerance Patient tolerated treatment well    Behavior During Therapy [facility] for tasks assessed/performed           Past Medical History:  Diagnosis Date  . Anxiety   . Bipolar 2 disorder (HCC)    on meds, good control- doing well  . Bipolar disorder (HCC)   . Headache   . Kidney stone     Past Surgical History:  Procedure Laterality Date  . NO PAST SURGERIES    . WISDOM TOOTH EXTRACTION  2014    There were no vitals filed for this visit.   Subjective Assessment - 03/06/20 0909    Subjective The clamshell is hard on my left side.  I like the one leaning on the table the best.    Patient Stated Goals be able to activate abdominal muscles and reduce waste circumference    Currently in Pain? No/denies                             [facility] Adult PT Treatment/Exercise - 03/06/20 0001      Lumbar Exercises: Supine   AB Set Limitations red band shoulder ext with PT holding band overhead    Clam 15 reps    Clam Limitations blue loop    Other Supine Lumbar Exercises supine on foam roller - UE alt; bent knee fallouts - 10x      Lumbar Exercises: Sidelying   Other Sidelying Lumbar Exercises thoracic rotation 5 x 10 sec      Lumbar Exercises: Quadruped   Single Arm Raise 10 reps;Right;Left    Other Quadruped Lumbar Exercises TrA activation      Manual Therapy   Soft tissue mobilization sidelying intercostals and thoracic paraspinals  breathing with compression on exhale                    PT Short Term Goals - 03/06/20 0847      PT SHORT TERM GOAL #1   Title ind with toileting techniques    Status Achieved      PT SHORT TERM GOAL #2   Title ind with able to activate TrA    Status Achieved             PT Long Term Goals - 03/06/20 1007      PT LONG TERM GOAL #1   Title pt will be able to have a BM without excessive force    Baseline getting better    Status On-going      PT LONG TERM GOAL #2   Title Pt will be able to demonstrate core strength to be able to perform 10 sec plank on knees.    Baseline transitioned to quadruped    Status On-going      PT LONG TERM GOAL #3   Title pt will be able to activate TrA and obliques  in order to demonstrate crunch with less than one knuckle depth of DRA    Status On-going      PT LONG TERM GOAL #4   Title pt will be ind with advanced HEP for participation in active lifestyle    Status On-going                 Plan - 03/06/20 1008    Clinical Impression Statement Pt did well with exercise progressions and was able to transitino to qped exercises and exercises on foam roll today.  Pt still needs cues to collapse ribcage with exhale for maximum core activation.  Continue according to plan of care is recommended.    PT Treatment/Interventions ADLs/Self Care Home Management;Biofeedback;Cryotherapy;Electrical Stimulation;Moist Heat;Manual techniques;Neuromuscular re-education;Therapeutic exercise;Therapeutic activities;Patient/family education;Passive range of motion;Dry needling;Taping    PT Next Visit Plan exercises on foam roller, seated on ball, quadruped progression; hollow hold; obliques    PT Home Exercise Plan Access Code: [email or web address]    Consulted and Agree with Plan of Care Patient           Patient will benefit from skilled therapeutic intervention in order to improve the following deficits and impairments:  Increased muscle spasms,  Decreased coordination, Decreased strength, Postural dysfunction  Visit Diagnosis: Muscle weakness (generalized)  Unspecified lack of coordination     Problem List Patient Active Problem List   Diagnosis Date Noted  . Pregnancy 11/29/2019  . Indication for care in labor or delivery 04/19/2018  . SVD (spontaneous vaginal delivery) 04/19/2018    Junious Silk, PT 03/06/2020, 10:11 AM  [facility] [address] [place], [place], [address] Phone: [phone]   Fax:  [phone]  Name: Kathleen Bass MRN: [id number] Date of Birth: Dec 22, 1990

## 2020-03-13 ENCOUNTER — Ambulatory Visit: Payer: Managed Care, Other (non HMO) | Attending: Obstetrics & Gynecology | Admitting: Physical Therapy

## 2020-03-13 ENCOUNTER — Other Ambulatory Visit: Payer: Self-pay

## 2020-03-13 ENCOUNTER — Encounter: Payer: Self-pay | Admitting: Physical Therapy

## 2020-03-13 DIAGNOSIS — R279 Unspecified lack of coordination: Secondary | ICD-10-CM | POA: Diagnosis present

## 2020-03-13 DIAGNOSIS — M6281 Muscle weakness (generalized): Secondary | ICD-10-CM | POA: Diagnosis not present

## 2020-03-13 NOTE — Therapy (Signed)
[facility] [address], [address] [place], [place], [address] Phone: [phone]   Fax:  [phone]  Physical Therapy Treatment  Patient Details  Name: Kathleen Bass MRN: [id number] Date of Birth: [birth year] Referring Provider (PT): Mitchel Honour, DO   Encounter Date: 03/13/2020   PT End of Session - 03/13/20 0802    Visit Number 4    Date for PT Re-Evaluation 05/15/20    PT Start Time 0758    PT Stop Time 0838    PT Time Calculation (min) 40 min    Activity Tolerance Patient tolerated treatment well    Behavior During Therapy [facility] for tasks assessed/performed           Past Medical History:  Diagnosis Date  . Anxiety   . Bipolar 2 disorder (HCC)    on meds, good control- doing well  . Bipolar disorder (HCC)   . Headache   . Kidney stone     Past Surgical History:  Procedure Laterality Date  . NO PAST SURGERIES    . WISDOM TOOTH EXTRACTION  2014    There were no vitals filed for this visit.   Subjective Assessment - 03/13/20 0806    Subjective Pt got a soft foam roller and that has been helping.  Reports things are feeling stronger and she is feeling better having a BM.    Currently in Pain? No/denies              [facility] PT Assessment - 03/13/20 0001      Special Tests   Other special tests abdomen circumference at level of umbilicus- 98.5 cm                         [facility] Adult PT Treatment/Exercise - 03/13/20 0001      Lumbar Exercises: Aerobic   Nustep L1 x 3 min; L3 x 3 min - PT present for status update      Lumbar Exercises: Standing   Other Standing Lumbar Exercises shoulder ext and pallof press - 15# power tower - 10x each       Lumbar Exercises: Seated   Hip Flexion on Ball Strengthening;Both;20 reps    Other Seated Lumbar Exercises circles       Lumbar Exercises: Supine   Clam 10 reps    Clam Limitations yellow loop - 3 sets 1x bilateral; 2x single leg      Lumbar  Exercises: Prone   Straight Leg Raise 10 reps    Straight Leg Raises Limitations prone on red ball      Lumbar Exercises: Quadruped   Madcat/Old Horse 5 reps    Straight Leg Raise 10 reps   on ball   Other Quadruped Lumbar Exercises fwd flex on ball                    PT Short Term Goals - 03/06/20 0847      PT SHORT TERM GOAL #1   Title ind with toileting techniques    Status Achieved      PT SHORT TERM GOAL #2   Title ind with able to activate TrA    Status Achieved             PT Long Term Goals - 03/06/20 1007      PT LONG TERM GOAL #1   Title pt will be able to have a BM without excessive force    Baseline  getting better    Status On-going      PT LONG TERM GOAL #2   Title Pt will be able to demonstrate core strength to be able to perform 10 sec plank on knees.    Baseline transitioned to quadruped    Status On-going      PT LONG TERM GOAL #3   Title pt will be able to activate TrA and obliques in order to demonstrate crunch with less than one knuckle depth of DRA    Status On-going      PT LONG TERM GOAL #4   Title pt will be ind with advanced HEP for participation in active lifestyle    Status On-going                 Plan - 03/13/20 0902    Clinical Impression Statement Pt is progressing steadily.  She has reduced abdominal circumference from 103cm down to 98.5cm.  Pt is able to demonstrate more challenging exercises with stability. She will benefit from skilled PT to continue to build strength for funcitonal activities    PT Treatment/Interventions ADLs/Self Care Home Management;Biofeedback;Cryotherapy;Electrical Stimulation;Moist Heat;Manual techniques;Neuromuscular re-education;Therapeutic exercise;Therapeutic activities;Patient/family education;Passive range of motion;Dry needling;Taping    PT Next Visit Plan exercises on foam roller, seated on ball, quadruped progression; hollow hold; obliques; hip hinge    PT Home Exercise Plan Access  Code: [email or web address]    Consulted and Agree with Plan of Care Patient           Patient will benefit from skilled therapeutic intervention in order to improve the following deficits and impairments:  Increased muscle spasms, Decreased coordination, Decreased strength, Postural dysfunction  Visit Diagnosis: Muscle weakness (generalized)  Unspecified lack of coordination     Problem List Patient Active Problem List   Diagnosis Date Noted  . Pregnancy 11/29/2019  . Indication for care in labor or delivery 04/19/2018  . SVD (spontaneous vaginal delivery) 04/19/2018    Junious Silk, PT 03/13/2020, 9:20 AM  [facility] [address], [address] [place], [place], [address] Phone: [phone]   Fax:  [phone]  Name: Kathleen Bass MRN: [id number] Date of Birth: August 11, 1990

## 2020-04-10 ENCOUNTER — Ambulatory Visit: Payer: Managed Care, Other (non HMO) | Attending: Obstetrics & Gynecology | Admitting: Physical Therapy

## 2020-04-10 ENCOUNTER — Encounter: Payer: Self-pay | Admitting: Physical Therapy

## 2020-04-10 ENCOUNTER — Other Ambulatory Visit: Payer: Self-pay

## 2020-04-10 DIAGNOSIS — R279 Unspecified lack of coordination: Secondary | ICD-10-CM | POA: Diagnosis present

## 2020-04-10 DIAGNOSIS — M6281 Muscle weakness (generalized): Secondary | ICD-10-CM | POA: Insufficient documentation

## 2020-04-10 NOTE — Therapy (Signed)
[facility] [address] [place], [place], [address] Phone: [phone]   Fax:  [phone]  Physical Therapy Treatment  Patient Details  Name: Kathleen Bass MRN: [id number] Date of Birth: 1991-05-26 Referring Provider (PT): Mitchel Honour, DO   Encounter Date: 04/10/2020   PT End of Session - 04/10/20 0758    Visit Number 5    Date for PT Re-Evaluation 05/15/20    PT Start Time 0758    PT Stop Time 0838    PT Time Calculation (min) 40 min    Activity Tolerance Patient tolerated treatment well    Behavior During Therapy [facility] for tasks assessed/performed           Past Medical History:  Diagnosis Date  . Anxiety   . Bipolar 2 disorder (HCC)    on meds, good control- doing well  . Bipolar disorder (HCC)   . Headache   . Kidney stone     Past Surgical History:  Procedure Laterality Date  . NO PAST SURGERIES    . WISDOM TOOTH EXTRACTION  2014    There were no vitals filed for this visit.   Subjective Assessment - 04/10/20 0803    Subjective Pt states the exercises are going well and she got an exercise ball.  Reports feeling like her abdomen feels smaller    Patient Stated Goals be able to activate abdominal muscles and reduce waste circumference    Currently in Pain? No/denies              [facility] PT Assessment - 04/10/20 0001      Special Tests   Other special tests 92cm                         [facility] Adult PT Treatment/Exercise - 04/10/20 0001      Lumbar Exercises: Aerobic   Nustep L1 x 3 min; L3 x 3 min - PT present for status update      Lumbar Exercises: Standing   Lifting Limitations hip hing single leg - 2lb10x each side; hip hinge bilat 20x - hands on trunk for TC to keep back flat    Other Standing Lumbar Exercises shoulder ext and pallof press - 15# power tower - 20x each  standing on foam mat      Lumbar Exercises: Seated   Hip Flexion on Ball  Strengthening;Both;20 reps    Other Seated Lumbar Exercises circles       Lumbar Exercises: Supine   Clam 10 reps    Clam Limitations yellow loop - 3 sets 1x bilateral; 2x single leg      Lumbar Exercises: Prone   Straight Leg Raise 10 reps    Straight Leg Raises Limitations prone on red ball      Lumbar Exercises: Quadruped   Madcat/Old Horse 5 reps    Straight Leg Raise 10 reps   on ball   Other Quadruped Lumbar Exercises fwd flex on ball    Other Quadruped Lumbar Exercises primal press up - 20x                    PT Short Term Goals - 03/06/20 0847      PT SHORT TERM GOAL #1   Title ind with toileting techniques    Status Achieved      PT SHORT TERM GOAL #2   Title ind with able to activate TrA    Status  Achieved             PT Long Term Goals - 04/10/20 0805      PT LONG TERM GOAL #1   Title pt will be able to have a BM without excessive force    Status Achieved      PT LONG TERM GOAL #2   Title Pt will be able to demonstrate core strength to be able to perform 10 sec plank on knees.      PT LONG TERM GOAL #4   Title pt will be ind with advanced HEP for participation in active lifestyle    Status On-going                 Plan - 04/10/20 0920    Clinical Impression Statement Pt making significant progress with 2 finger diastasis and 1 knuckle depth, circumference is 92 cm down from 103 at eval.  Pt is doing well with HEP.  She was able to add hip hinge.  Needed a lot of education and cues to perform correctly.  Pt will benefit from skilled PT to continue to progress functional movements with core strength    PT Treatment/Interventions ADLs/Self Care Home Management;Biofeedback;Cryotherapy;Electrical Stimulation;Moist Heat;Manual techniques;Neuromuscular re-education;Therapeutic exercise;Therapeutic activities;Patient/family education;Passive range of motion;Dry needling;Taping    PT Next Visit Plan plank on knees; half kneeling and kneeling  pallof single leg hip hinge and step down    PT Home Exercise Plan Access Code: [email or web address]    Consulted and Agree with Plan of Care Patient           Patient will benefit from skilled therapeutic intervention in order to improve the following deficits and impairments:  Increased muscle spasms, Decreased coordination, Decreased strength, Postural dysfunction  Visit Diagnosis: Muscle weakness (generalized)  Unspecified lack of coordination     Problem List Patient Active Problem List   Diagnosis Date Noted  . Pregnancy 11/29/2019  . Indication for care in labor or delivery 04/19/2018  . SVD (spontaneous vaginal delivery) 04/19/2018    Junious Silk, PT 04/10/2020, 10:47 AM  [facility] [address], [address] [place], [place], [address] Phone: [phone]   Fax:  [phone]  Name: Kathleen Bass MRN: [id number] Date of Birth: September 07, 1990

## 2020-04-24 ENCOUNTER — Ambulatory Visit: Payer: Managed Care, Other (non HMO) | Admitting: Physical Therapy

## 2020-04-24 ENCOUNTER — Encounter: Payer: Self-pay | Admitting: Physical Therapy

## 2020-04-24 ENCOUNTER — Other Ambulatory Visit: Payer: Self-pay

## 2020-04-24 DIAGNOSIS — M6281 Muscle weakness (generalized): Secondary | ICD-10-CM

## 2020-04-24 DIAGNOSIS — R279 Unspecified lack of coordination: Secondary | ICD-10-CM

## 2020-04-24 NOTE — Therapy (Signed)
[facility] [address] [place], [place], [address] Phone: [phone]   Fax:  [phone]  Physical Therapy Treatment  Patient Details  Name: Kathleen Bass MRN: [id number] Date of Birth: 08-08-1991 Referring Provider (PT): Linda Hedges, DO   Encounter Date: 04/24/2020   PT End of Session - 04/24/20 0803    Visit Number 6    Date for PT Re-Evaluation 05/15/20    PT Start Time 0800    PT Stop Time 0838    PT Time Calculation (min) 38 min    Activity Tolerance Patient tolerated treatment well    Behavior During Therapy [facility] for tasks assessed/performed           Past Medical History:  Diagnosis Date  . Anxiety   . Bipolar 2 disorder (HCC)    on meds, good control- doing well  . Bipolar disorder ([place])   . Headache   . Kidney stone     Past Surgical History:  Procedure Laterality Date  . NO PAST SURGERIES    . [place] EXTRACTION  2014    There were no vitals filed for this visit.   Subjective Assessment - 04/24/20 0805    Subjective The exercises are going well and feeling good doing some planks in my workout.  I can't hold them very long but trying.    Patient Stated Goals be able to activate abdominal muscles and reduce waste circumference    Currently in Pain? No/denies                             [facility] Adult PT Treatment/Exercise - 04/24/20 0001      Lumbar Exercises: Aerobic   UBE (Upper Arm Bike) L1.6 3/3 min fwd/back      Lumbar Exercises: Seated   Other Seated Lumbar Exercises half knee and kneel rotation and press      Lumbar Exercises: Supine   Other Supine Lumbar Exercises LE 90 deg - tapping LE to mat      Lumbar Exercises: Quadruped   Opposite Arm/Leg Raise Right arm/Left leg;Left arm/Right leg;10 reps    Other Quadruped Lumbar Exercises plank on knees 2x30 sec    Other Quadruped Lumbar Exercises primal press up - 10x           added and performed  exercises to HEP as seen in chart       PT Education - 04/24/20 0844    Education Details Access Code: [email or web address]            PT Short Term Goals - 03/06/20 0847      PT SHORT TERM GOAL #1   Title ind with toileting techniques    Status Achieved      PT SHORT TERM GOAL #2   Title ind with able to activate TrA    Status Achieved             PT Long Term Goals - 04/24/20 0901      PT LONG TERM GOAL #1   Title pt will be able to have a BM without excessive force    Status Achieved      PT LONG TERM GOAL #2   Title Pt will be able to demonstrate core strength to be able to perform 10 sec plank on knees.    Baseline 30 sec    Status Achieved      PT LONG TERM GOAL #3  Title pt will be able to activate TrA and obliques in order to demonstrate crunch with less than one knuckle depth of DRA    Status Achieved      PT LONG TERM GOAL #4   Title pt will be ind with advanced HEP for participation in active lifestyle    Status Achieved                 Plan - 04/24/20 0845    Clinical Impression Statement Pt has met goals.  She is ind with HEP at this time to progress the remaining core weaknesses.  Pt will discharge with HEP today    PT Treatment/Interventions ADLs/Self Care Home Management;Biofeedback;Cryotherapy;Electrical Stimulation;Moist Heat;Manual techniques;Neuromuscular re-education;Therapeutic exercise;Therapeutic activities;Patient/family education;Passive range of motion;Dry needling;Taping    PT Next Visit Plan d/c    PT Home Exercise Plan Access Code: [email or web address]    Consulted and Agree with Plan of Care Patient           Patient will benefit from skilled therapeutic intervention in order to improve the following deficits and impairments:  Increased muscle spasms, Decreased coordination, Decreased strength, Postural dysfunction  Visit Diagnosis: Muscle weakness (generalized)  Unspecified lack of coordination     Problem List Patient Active  Problem List   Diagnosis Date Noted  . Pregnancy 11/29/2019  . Indication for care in labor or delivery 04/19/2018  . SVD (spontaneous vaginal delivery) 04/19/2018    Jule Ser, PT 04/24/2020, 9:02 AM  [facility] [address] [place], [place], [address] Phone: [phone]   Fax:  [phone]  Name: Kathleen Bass MRN: [id number] Date of Birth: 04/03/1991  PHYSICAL THERAPY DISCHARGE SUMMARY  Visits from Start of Care: 6  Current functional level related to goals / functional outcomes: See above details   Remaining deficits: See above   Education / Equipment: HEP  Plan: Patient agrees to discharge.  Patient goals were met. Patient is being discharged due to meeting the stated rehab goals.  ?????    [employer], PT 04/24/20 9:02 AM

## 2020-04-24 NOTE — Patient Instructions (Signed)
Access Code: [email or web address] URL: [email or web address] Date: 04/24/2020 Prepared by: Dwana Curd  Exercises Supine Diaphragmatic Breathing - 3 x daily - 7 x weekly - 10 reps - 1 sets Hooklying Transversus Abdominis Palpation - 3 x daily - 7 x weekly - 1 sets - 10 reps Thoracic Extension Mobilization with Noodle - 3 x daily - 7 x weekly - 1 sets - 10 reps Supine Diastasis Recti Correction with Sheet - 1 x daily - 7 x weekly - 3 sets - 10 reps Clamshells with Baby - 1 x daily - 7 x weekly - 3 sets - 10 reps Quadruped Transversus Abdominis Bracing - 1 x daily - 7 x weekly - 10 reps - 2 sets - 5 sec hold Prone Flexion Stretch on Swiss Ball - 1 x daily - 7 x weekly - 3 sets - 10 reps Dead Bug on Foam Roll - 1 x daily - 7 x weekly - 3 sets - 10 reps Kettlebell Deadlift - 1 x daily - 7 x weekly - 3 sets - 10 reps Half Kneeling Anti-Rotation Press - Forward Leg Anchor Side - 1 x daily - 7 x weekly - 3 sets - 10 reps Tall Kneeling Anti-Rotation Press - 1 x daily - 7 x weekly - 3 sets - 10 reps

## 2020-05-01 ENCOUNTER — Encounter: Payer: Managed Care, Other (non HMO) | Admitting: Physical Therapy

## 2020-05-08 ENCOUNTER — Ambulatory Visit: Payer: Managed Care, Other (non HMO) | Admitting: Physical Therapy

## 2021-07-22 IMAGING — US US RENAL
1 series · 14 of 25 positions shown · non-contrast
Comparison: 09/05/2017

CLINICAL DATA: Left flank pain, 26 weeks pregnant

EXAM:
RENAL / URINARY TRACT ULTRASOUND COMPLETE

[Series 1: us renal · 52 acquisitions, 14 frames shown]
[im 1/52]
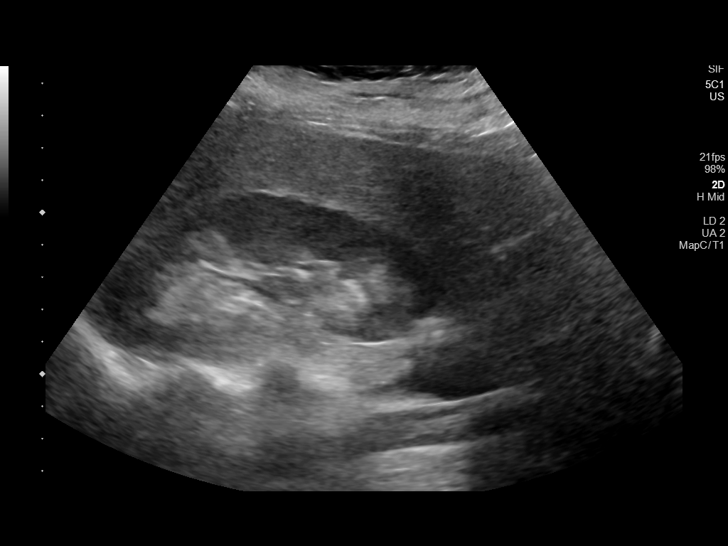
[im 5/52]
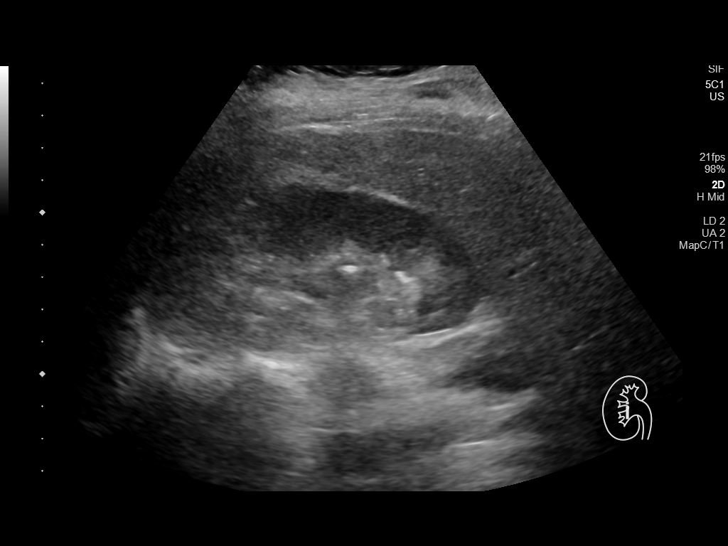
[im 9/52]
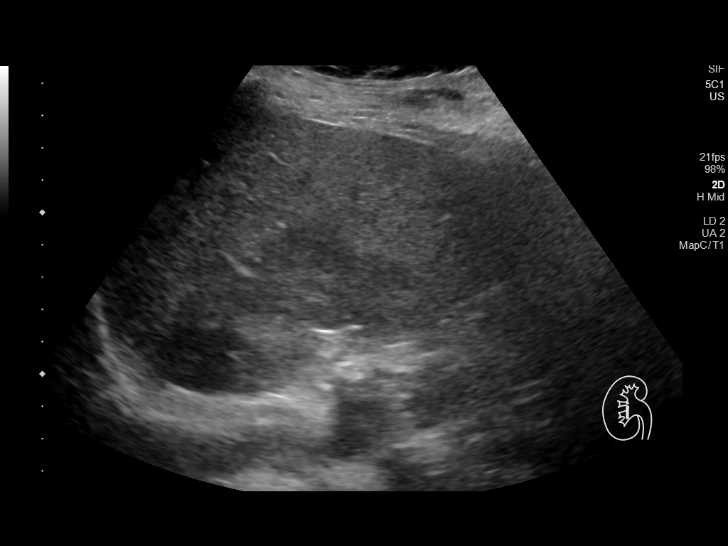
[im 13/52]
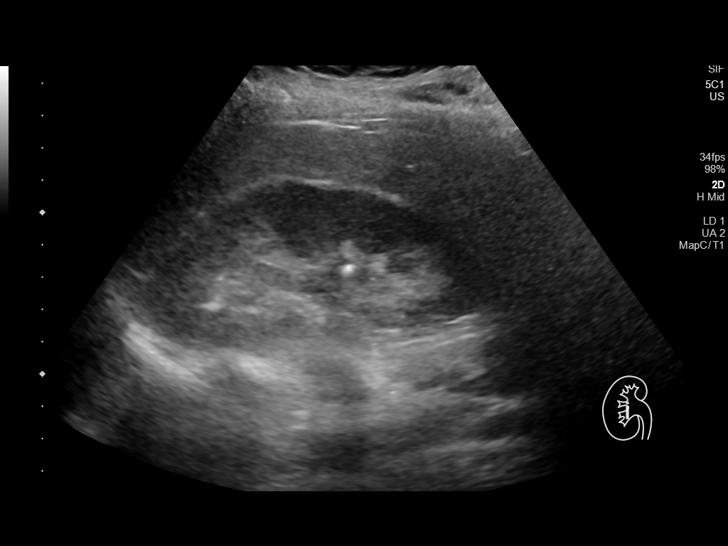
[im 18/52]
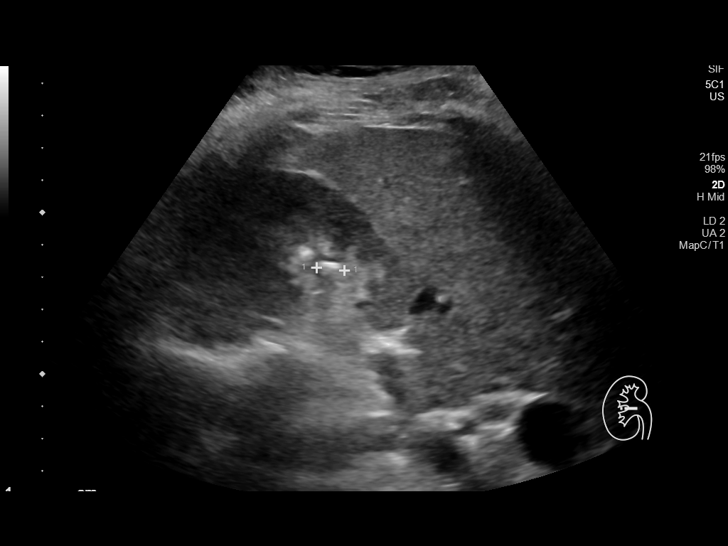
[im 20/52]
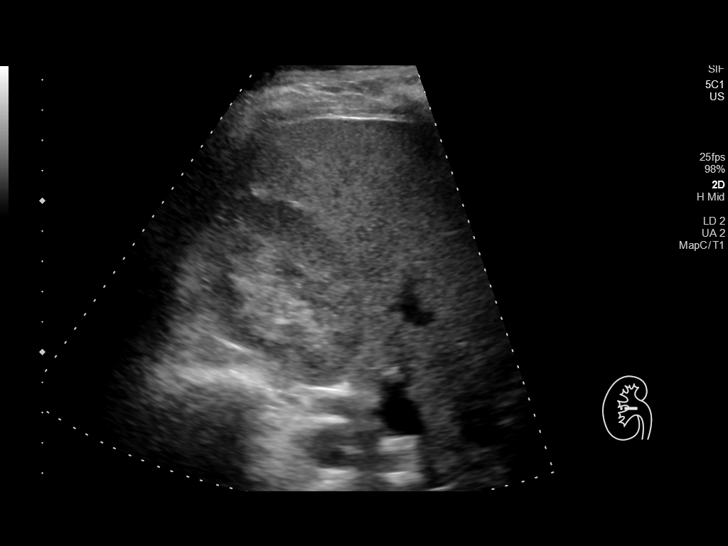
[im 24/52]
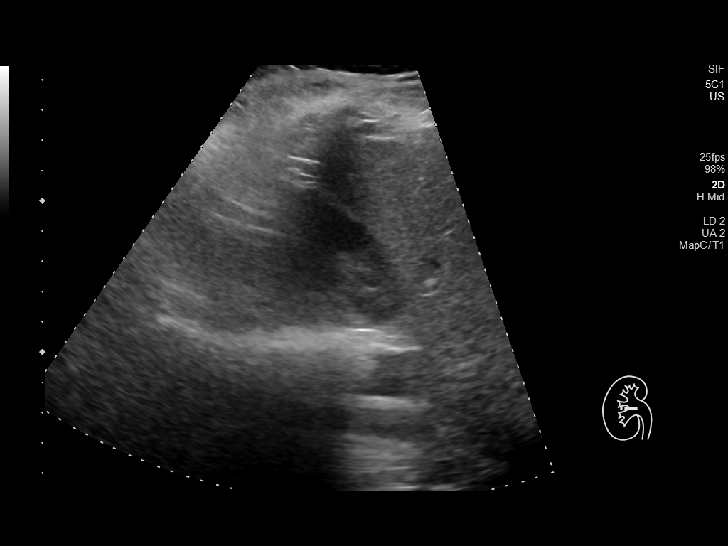
[im 28/52]
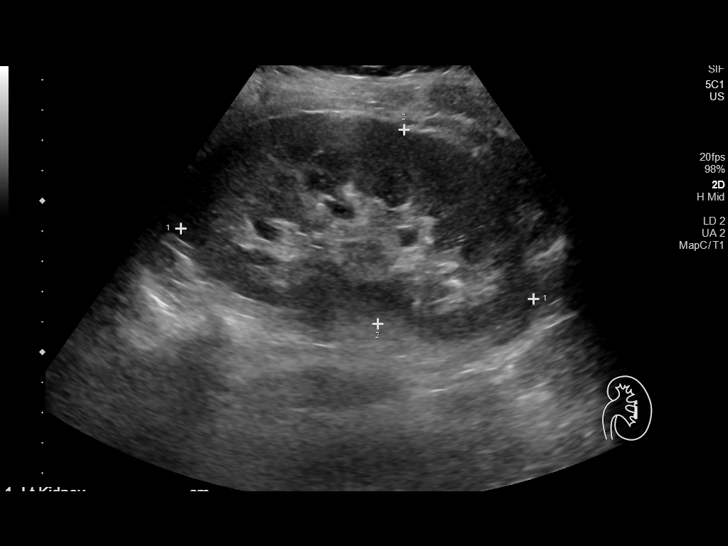
[im 32/52]
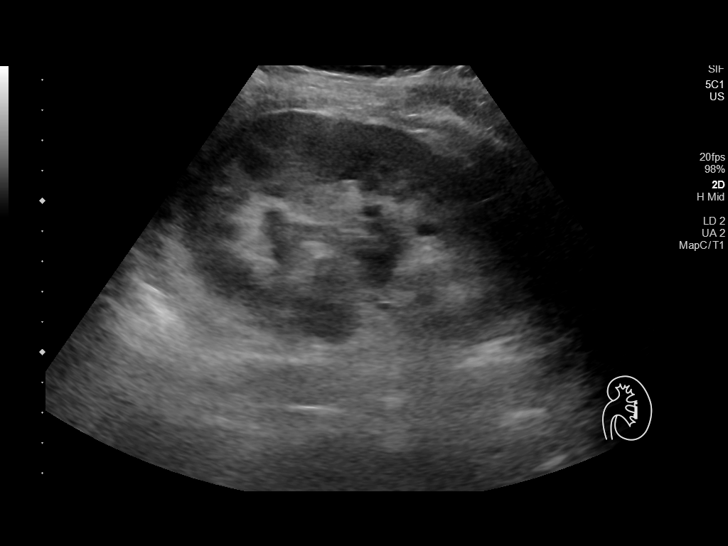
[im 35/52]
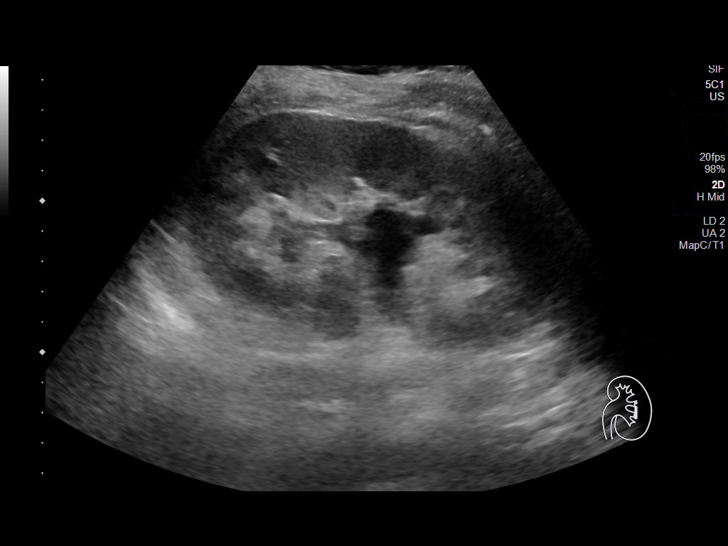
[im 39/52]
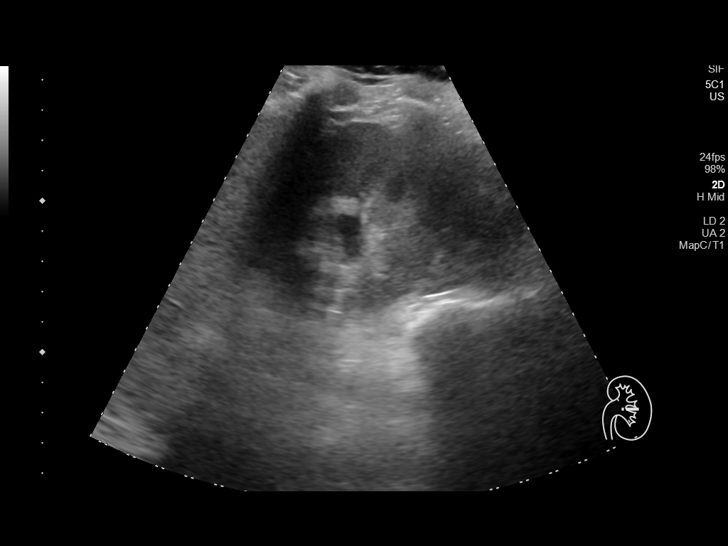
[im 43/52]
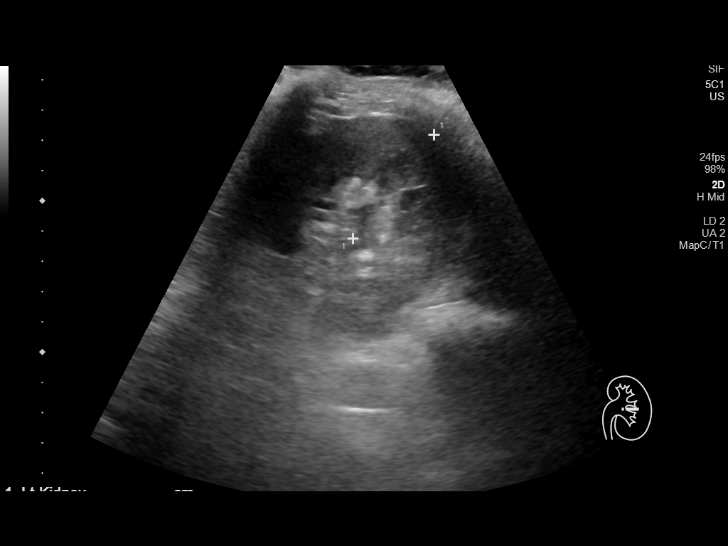
[im 47/52]
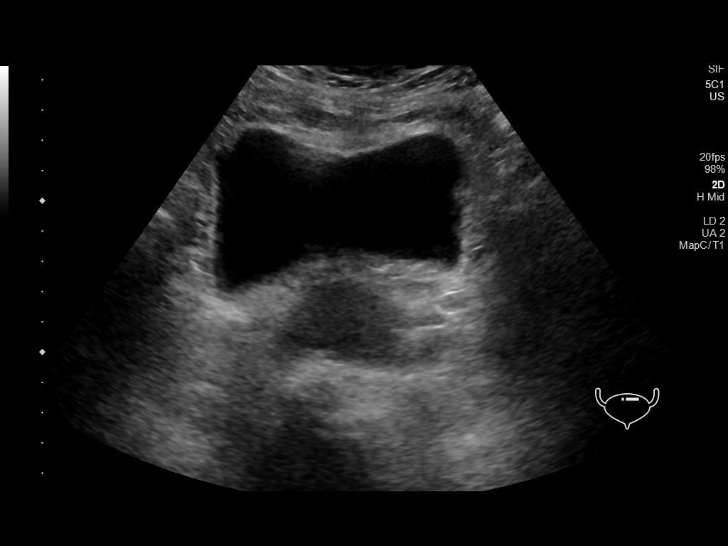
[im 52/52]
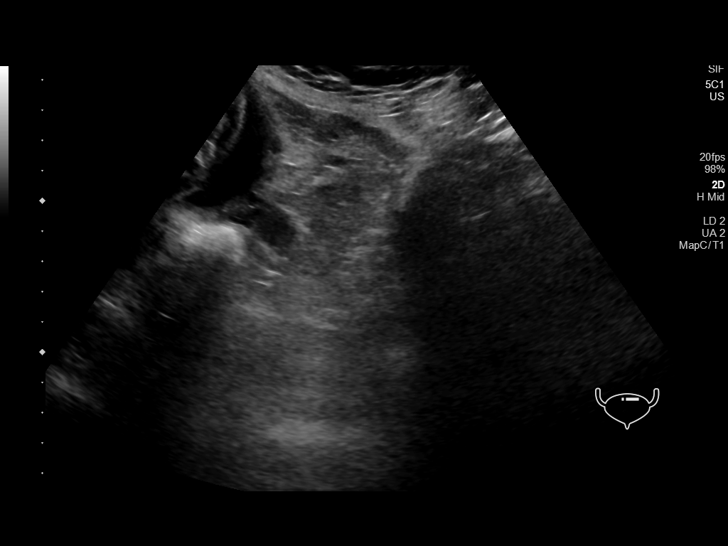

[14 of 25 positions shown; findings below may reference images not displayed]

FINDINGS: Right Kidney:

Renal measurements: 10.3 x 4.8 x 4.8 cm = volume: 124 mL. Normal
parenchymal echogenicity. 5 x 4 x 9 mm lower pole calculus. No
hydronephrosis.

Left Kidney:

Renal measurements: 11.9 x 6.5 x 4.4 cm = volume: 175 mL. Normal
parenchymal echogenicity. No renal calculi. Moderate hydronephrosis.

Bladder:

Within normal limits.

Other:

None.
IMPRESSION: 9 mm right lower pole renal calculus.

Moderate left hydronephrosis. Obstructing ureteral calculus not
visualized on ultrasound, but not excluded.
